# Patient Record
Sex: Male | Born: 1950 | Race: White | Hispanic: No | Marital: Single | Smoking: Former smoker
Health system: Southern US, Community
[De-identification: ages and names within clinical notes are randomized; demographics above are authoritative.]

## PROBLEM LIST (undated history)

## (undated) DIAGNOSIS — I10 Essential (primary) hypertension: Secondary | ICD-10-CM

## (undated) DIAGNOSIS — S065XAA Traumatic subdural hemorrhage with loss of consciousness status unknown, initial encounter: Secondary | ICD-10-CM

## (undated) DIAGNOSIS — S065X9A Traumatic subdural hemorrhage with loss of consciousness of unspecified duration, initial encounter: Secondary | ICD-10-CM

## (undated) DIAGNOSIS — K560 Paralytic ileus: Secondary | ICD-10-CM

## (undated) DIAGNOSIS — J690 Pneumonitis due to inhalation of food and vomit: Secondary | ICD-10-CM

## (undated) DIAGNOSIS — Z8673 Personal history of transient ischemic attack (TIA), and cerebral infarction without residual deficits: Secondary | ICD-10-CM

## (undated) DIAGNOSIS — E785 Hyperlipidemia, unspecified: Secondary | ICD-10-CM

## (undated) DIAGNOSIS — D649 Anemia, unspecified: Secondary | ICD-10-CM

## (undated) DIAGNOSIS — K219 Gastro-esophageal reflux disease without esophagitis: Secondary | ICD-10-CM

## (undated) DIAGNOSIS — F1910 Other psychoactive substance abuse, uncomplicated: Secondary | ICD-10-CM

## (undated) DIAGNOSIS — N10 Acute tubulo-interstitial nephritis: Secondary | ICD-10-CM

## (undated) HISTORY — DX: Other psychoactive substance abuse, uncomplicated: F19.10

## (undated) HISTORY — PX: OTHER SURGICAL HISTORY: SHX169

## (undated) HISTORY — DX: Paralytic ileus: K56.0

## (undated) HISTORY — DX: Traumatic subdural hemorrhage with loss of consciousness status unknown, initial encounter: S06.5XAA

## (undated) HISTORY — DX: Acute pyelonephritis: N10

## (undated) HISTORY — DX: Gastro-esophageal reflux disease without esophagitis: K21.9

## (undated) HISTORY — DX: Traumatic subdural hemorrhage with loss of consciousness of unspecified duration, initial encounter: S06.5X9A

## (undated) HISTORY — PX: APPENDECTOMY: SHX54

## (undated) HISTORY — DX: Essential (primary) hypertension: I10

## (undated) HISTORY — DX: Hyperlipidemia, unspecified: E78.5

## (undated) HISTORY — DX: Pneumonitis due to inhalation of food and vomit: J69.0

## (undated) HISTORY — DX: Personal history of transient ischemic attack (TIA), and cerebral infarction without residual deficits: Z86.73

## (undated) HISTORY — DX: Anemia, unspecified: D64.9

---

## 2012-12-03 DIAGNOSIS — J690 Pneumonitis due to inhalation of food and vomit: Secondary | ICD-10-CM

## 2012-12-03 DIAGNOSIS — Z8673 Personal history of transient ischemic attack (TIA), and cerebral infarction without residual deficits: Secondary | ICD-10-CM

## 2012-12-03 HISTORY — DX: Pneumonitis due to inhalation of food and vomit: J69.0

## 2012-12-03 HISTORY — DX: Personal history of transient ischemic attack (TIA), and cerebral infarction without residual deficits: Z86.73

## 2012-12-09 DIAGNOSIS — K560 Paralytic ileus: Secondary | ICD-10-CM

## 2012-12-09 HISTORY — DX: Paralytic ileus: K56.0

## 2012-12-20 ENCOUNTER — Non-Acute Institutional Stay (SKILLED_NURSING_FACILITY): Payer: PRIVATE HEALTH INSURANCE | Admitting: Internal Medicine

## 2012-12-20 DIAGNOSIS — I69959 Hemiplegia and hemiparesis following unspecified cerebrovascular disease affecting unspecified side: Secondary | ICD-10-CM

## 2012-12-20 DIAGNOSIS — S065X9A Traumatic subdural hemorrhage with loss of consciousness of unspecified duration, initial encounter: Secondary | ICD-10-CM

## 2012-12-20 DIAGNOSIS — N10 Acute tubulo-interstitial nephritis: Secondary | ICD-10-CM

## 2012-12-20 DIAGNOSIS — E860 Dehydration: Secondary | ICD-10-CM

## 2012-12-20 DIAGNOSIS — S065XAA Traumatic subdural hemorrhage with loss of consciousness status unknown, initial encounter: Secondary | ICD-10-CM

## 2012-12-20 DIAGNOSIS — I6200 Nontraumatic subdural hemorrhage, unspecified: Secondary | ICD-10-CM

## 2012-12-20 NOTE — Progress Notes (Signed)
Patient ID: Douglas Vargas, male   DOB: 08-22-1950, 62 y.o.   MRN: [id number]  Facility; [facility] Chief complaint; admission to SNF post admit to [facility] health care from October 1 to October 13  History; this is a 62 year old man who presented to [facility] after a fall and altered mental status with urinary incontinence. He had been out of his medications for a week [Plavix and aspirin for TIAs]. CT scan revealed a right subdural hematoma and a right middle cerebral artery ischemic [place]p with right to left midline shift the. The patient was intubated for airway protection and transferred to [facility]. He was noted to have continuous dysarthria and left sided weakness. Neurosurgery did not recommend surgery for his right subdural hematoma. He was kept on Keppra for seizure prophylaxis. Aspirin and Plavix were held due to subdural hematoma. There is some suggestion of an intracranial bleed as well. The patient received hypertonic saline and mannitol for hyponatremia with parenchymal edema and midline shift. He was said to have a waxing and waning mental status. He did not have alcohol withdrawal. He did have visual hallucinations. He was given thiamine.   On October 7 the patient was right upper quadrant ultrasound revealed distended gallbladder with stones and sludge.noted to have abdominal distention with tenderness. A KUB revealed ileo-as with the transverse colon at 8 mm.  A colonoscopy was done which was negative for. He also had acute kidney injury with a baseline creatinine of 0.6 rising to a high of 2 this resolved with IV fluid. His hydrochlorothiazide and lisinopril were discontinued. He was also felt to have a left lower lobe aspiration/chemical pneumonitis. He was given a course of clindamycin.  MRI of the brain on October 2 showed multiple areas of acute infarction in the right hemispheres and subdural hematomas.  Past medical  history; #1 TIAs #2 hypertension #3 tobacco abuse #4 alcohol abuse  Social; patient states he was living with a roommate and all [place]. He was a smoker and a daily alcohol intake beyond this I have no information.     Medication; Norvasc 10 mg a day; atorvastatin 80 daily, vitamin D 3 2003 tablets every day, clindamycin 300 every 6 stopped on October 14, vitamin B12 500 mcg 2 tablets daily, Colace 100 twice a day, but Toprolol 25 twice a day, multivitamin 1 tablet daily, omeprazole 20 mg daily, thiamine 100 daily,  Family history; patient states his father had a stroke  Review of systems    Respiratory; no cough no sputum Cardiac no chest pain GI no odontophagia no nausea no vomiting GU no cleared dysuria or hematuria Neurologic he does not complain of headache diplopia or dysphagia   physical examination O2 sat 92% on room air respirations 18 and unlabored pulse 92 HEENT very dry mucous membranes Lymph nonpalpable no cervical clavicular or axillary areas Respiratory; decreased air entry bilaterally but no crackles or wheezes there is no digital clubbing Cardiac heart sounds are normal there is no murmurs and no carotid bruits Abdomen his liver edge is palpable at the right costal margin there is no spleen there is no stigmata. No masses are noted. His abdomen does not appear to be distended. Bowel sounds are paws GU some suprapubic and right costal vertebral angle tenderness is noted Neurologic;  there is a right temporal visual field defect. Otherwise cranial nerves seem intact. He is beginning to have some mild recovery in all muscle groups in the left  arm and the left leg at 2-3/5. Reflexes are brisk on the left left Babinski is upgoing. Currently he is able to answer all my questions he does not appear to be delirious Extremities; mild venous stasis noted peripheral pulses are palpable.  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 The patient has a left-sided weakness is noted however his right-sided visual field defect is on the right. History of return of his strength on the left side gives her reason for guarded optimism #2 history of TIAs and now a major stroke. He is off all antiplatelet drugs because of the subdural hematoma. He is supposed to followup with his neurosurgeon who is Dr. Clelia Schaumann and I will be important to address this issue. #3 adynamic ileus this appears to have resolved the. #4 appears to be somewhat volume contracted his lab work will be checked we'll need to push oral fluids. #5 suprapubic and right CVA tenderness raises the possibility of a urinary tract infection A period of the right CVA and tenderness was so impressive that I am going to put him on empiric antibiotics will await for a urine culture #6 gastroesophageal reflux disease #7 hypertension he was on an ACE inhibitor prior to this stopped it due to acute kidney injury is now on metoprolol and amlodipine per #8 on atorvastatin I think this was since this stroke I don't believe he was on this long-term he is not a diabetic.  Several concerning issues with her with regards to this man. Poor oral intake is the paramount concern. I am going to treat him in paraplegia for a right pyelonephritis.

## 2012-12-22 ENCOUNTER — Non-Acute Institutional Stay (SKILLED_NURSING_FACILITY): Payer: PRIVATE HEALTH INSURANCE | Admitting: Family

## 2012-12-22 ENCOUNTER — Encounter: Payer: Self-pay | Admitting: Family

## 2012-12-22 DIAGNOSIS — R110 Nausea: Secondary | ICD-10-CM

## 2012-12-22 DIAGNOSIS — K219 Gastro-esophageal reflux disease without esophagitis: Secondary | ICD-10-CM

## 2012-12-22 DIAGNOSIS — I10 Essential (primary) hypertension: Secondary | ICD-10-CM

## 2012-12-22 NOTE — Progress Notes (Signed)
Patient ID: Douglas Vargas, male   DOB: 08-16-1950, 62 y.o.   MRN: [id number] Date:12/22/12  Facility: [facility]  Code Status:  Full  Chief Complaint  Patient presents with  . Acute Visit    Nausea    HPI: Pt presents with c/o of acute onset of nausea.  Pt denies vomiting, diarrhea, fever, chills, SOB, CP, or abdominal pain.  Pt describes nausea as constant, and endorses that nausea is sometime alleviated without intervention. Pt denies treatment of current bout of nausea. Pt reports regular bowel movement and regular appetite.Pt and health care team denies further concerns/issues at present.       Allergies  Allergen Reactions  . Sulfa Antibiotics      Medication List       This list is accurate as of: 12/22/12  3:36 PM.  Always use your most recent med list.               amLODipine 10 MG tablet  Commonly known as:  NORVASC  Take 10 mg by mouth daily.     atorvastatin 80 MG tablet  Commonly known as:  LIPITOR  Take 80 mg by mouth daily.     cyanocobalamin 500 MCG tablet  Take 500 mcg by mouth daily.     docusate sodium 100 MG capsule  Commonly known as:  COLACE  Take 100 mg by mouth 2 (two) times daily.     metoprolol tartrate 25 MG tablet  Commonly known as:  LOPRESSOR  Take 25 mg by mouth 2 (two) times daily.     multivitamin with minerals Tabs tablet  Take 1 tablet by mouth daily.     omeprazole 20 MG capsule  Commonly known as:  PRILOSEC  Take 20 mg by mouth daily.     thiamine 100 MG tablet  Take 100 mg by mouth daily.         DATA REVIEWED   Laboratory Studies: 12/15/12 WBC 11, Creatinine 0.6, K in mid 3s s/p multiple repletion (per discharge summary), CEA 2.3     Past Medical History  Diagnosis Date  . Hypertension   . Hyperlipidemia   . Anemia   . GERD (gastroesophageal reflux disease)        Review of Systems  Constitutional: Negative.   HENT: Negative.   Eyes: Negative.   Respiratory: Negative.   Cardiovascular:  Negative.   Gastrointestinal: Positive for nausea.  Genitourinary: Positive for urgency.  Musculoskeletal: Positive for falls.  Skin: Negative.   Neurological: Positive for focal weakness.  Endo/Heme/Allergies: Negative.   Psychiatric/Behavioral: Negative.      Physical Exam Filed Vitals:   12/22/12 1527  BP: 116/78  Pulse: 94  Temp: 96.9 F (36.1 C)  Resp: 20   There is no height or weight on file to calculate BMI. Physical Exam  Constitutional:  Pt is supine position in facility's bed with safety precautions in place. Pt wearing hospital gown.  HENT:  Mouth/Throat: Abnormal dentition.  Neck: No thyromegaly present.  Cardiovascular: Normal rate, regular rhythm and normal heart sounds.   Pulmonary/Chest: Effort normal and breath sounds normal.  Neurological: He is alert. GCS eye subscore is 4. GCS verbal subscore is 4. GCS motor subscore is 6.  Oriented to self/L sided hemiparesis  Skin: Skin is warm and dry.  Discoloration of BLE  Psychiatric: He has a normal mood and affect. His speech is normal. Thought content is paranoid.      ASSESSMENT/PLAN Hypertension-Will continue with current treatment plan;  pt is presently stable GERD-Will continue to monitor and evaluate treatment plan; pt endorses alleviation of nausea Nausea- Will continue to monitor for GI upset; Pt endorses nausea is mitigated without intervention at present   Follow up:prn

## 2013-01-01 ENCOUNTER — Non-Acute Institutional Stay (SKILLED_NURSING_FACILITY): Payer: PRIVATE HEALTH INSURANCE | Admitting: Internal Medicine

## 2013-01-01 DIAGNOSIS — D473 Essential (hemorrhagic) thrombocythemia: Secondary | ICD-10-CM | POA: Insufficient documentation

## 2013-01-01 DIAGNOSIS — D638 Anemia in other chronic diseases classified elsewhere: Secondary | ICD-10-CM

## 2013-01-01 NOTE — Progress Notes (Signed)
PROGRESS NOTE  DATE: 01/01/2013  FACILITY:  [facility]  LEVEL OF CARE: SNF (31)  Acute Visit  CHIEF COMPLAINT:  Manage anemia of chronic disease  HISTORY OF PRESENT ILLNESS: I was requested by the staff to assess the patient regarding above problem(s):  ANEMIA: The anemia has been stable. The patient denies fatigue, melena or hematochezia. Currently not on iron. On 12-22-12 hemoglobin 11, MCV 87.  PAST MEDICAL HISTORY : Reviewed.  No changes.  CURRENT MEDICATIONS: Reviewed per [facility]  PHYSICAL EXAMINATION  GENERAL: no acute distress, normal body habitus RESPIRATORY: breathing is even & unlabored, BS CTAB CARDIAC: RRR, no murmur,no extra heart sounds, no edema  LABS/RADIOLOGY: See history of present illness  12-22-12 platelets 450  ASSESSMENT/PLAN:  Anemia of chronic disease-Will monitor. Thrombocytosis-likely acute phase reactant will monitor.  CPT CODE: 84132

## 2013-03-15 ENCOUNTER — Encounter: Payer: Self-pay | Admitting: *Deleted

## 2013-03-18 ENCOUNTER — Encounter: Payer: Self-pay | Admitting: Family

## 2013-03-18 ENCOUNTER — Non-Acute Institutional Stay (SKILLED_NURSING_FACILITY): Payer: PRIVATE HEALTH INSURANCE | Admitting: Family

## 2013-03-18 DIAGNOSIS — M129 Arthropathy, unspecified: Secondary | ICD-10-CM

## 2013-03-18 DIAGNOSIS — M1990 Unspecified osteoarthritis, unspecified site: Secondary | ICD-10-CM

## 2013-03-18 DIAGNOSIS — I10 Essential (primary) hypertension: Secondary | ICD-10-CM

## 2013-03-18 DIAGNOSIS — K5900 Constipation, unspecified: Secondary | ICD-10-CM

## 2013-03-18 DIAGNOSIS — D638 Anemia in other chronic diseases classified elsewhere: Secondary | ICD-10-CM

## 2013-03-18 NOTE — Progress Notes (Signed)
Patient ID: SETH HIGGINBOTHAM, male   DOB: 1950/04/24, 63 y.o.   MRN: [id number]  Date: 03/18/13 Facility: [facility]  Code Status:  Full  Chief Complaint  Patient presents with  . Medical Managment of Chronic Issues    Routine Visit    HPI: Pt is being followed for the medical management of chronic illnesses. Pt reports bout of constipation that is unrelieved by current regimen.  Pt reports gas and abdominal discomfort.  Pt further report L shoulder pain onset > one week. Pt reports stretching arm in therapy and then experiencing acute pain shortly after. Pt reports L shoulder pain limits ROM.  Pt reports desire to review medical history with NP to discuss care plan. No further issues/concerns expressed at present.      Allergies  Allergen Reactions  . Sulfa Antibiotics      Medication List       This list is accurate as of: 03/18/13  3:35 PM.  Always use your most recent med list.               amLODipine 10 MG tablet  Commonly known as:  NORVASC  Take 10 mg by mouth daily.     atorvastatin 80 MG tablet  Commonly known as:  LIPITOR  Take 80 mg by mouth daily.     cholecalciferol 1000 UNITS tablet  Commonly known as:  VITAMIN D  Take 1,000 Units by mouth daily. 4000 units daily     cyanocobalamin 500 MCG tablet  Take 500 mcg by mouth daily.     docusate sodium 100 MG capsule  Commonly known as:  COLACE  Take 100 mg by mouth 2 (two) times daily.     metoprolol tartrate 25 MG tablet  Commonly known as:  LOPRESSOR  Take 25 mg by mouth 2 (two) times daily.     multivitamin with minerals Tabs tablet  Take 1 tablet by mouth daily.     omeprazole 20 MG capsule  Commonly known as:  PRILOSEC  Take 20 mg by mouth daily.         DATA REVIEWED  Laboratory Studies: 02/20/13-WBC 6.5, Hemoglobin 10.4, Hematocrit 32.5, Platelets 239 12/22/12-BUN 20, Creatinine 0.73, Na 144, K 4.6, Cl 100, Ca 8.9 Past Medical History  Diagnosis Date  . Hypertension   .  Hyperlipidemia   . Anemia   . GERD (gastroesophageal reflux disease)   . Substance abuse     alcohol  . Hx-TIA (transient ischemic attack) 12/03/2012    Acute right MCA  . Pneumonitis, aspiration 12/03/2012  . Adynamic ileus 12/09/2012  . Subdural hematoma   . Acute pyelonephritis     Review of Systems  Constitutional: Negative.   HENT: Negative.   Eyes: Negative.   Respiratory: Negative.   Cardiovascular: Negative.   Gastrointestinal: Positive for abdominal pain and constipation.  Musculoskeletal: Positive for joint pain.  Skin: Negative.   Neurological:       L sided hemiplegia  Endo/Heme/Allergies: Negative.   Psychiatric/Behavioral: Negative.      Physical Exam Filed Vitals:   03/18/13 1521  BP: 124/76  Pulse: 68  Temp: 97.6 F (36.4 C)  Resp: 18   There is no height or weight on file to calculate BMI. Physical Exam  Constitutional: He is oriented to person, place, and time.  Cardiovascular: Normal rate and regular rhythm.   Pulmonary/Chest: Effort normal and breath sounds normal.  Abdominal: Soft. Bowel sounds are normal.  Musculoskeletal:  Left shoulder: He exhibits decreased range of motion, tenderness and pain.  Neurological: He is alert and oriented to person, place, and time.    ASSESSMENT/PLAN 1)Unspecified Constipation-Obtain KUB, Dulcolax suppository x 1, Senna tablet daily 2)Arthritis-Xray L shoulder 3)Obtain CBC, CMP, Vitamin D, Vitamin B12, Folate 4)HTN- will continue current medication treatment; pt is stable 5)Subdural Hematoma-pt was referred to Neuro  6)GERD-will continue current medication; pt stable  7)Anemia of Chronic Disease-will obtain labs to evaluate  Follow up:prn  Time spent 50 minutes

## 2013-03-20 ENCOUNTER — Encounter: Payer: Self-pay | Admitting: *Deleted

## 2013-07-13 ENCOUNTER — Non-Acute Institutional Stay (SKILLED_NURSING_FACILITY): Payer: Medicare Other | Admitting: Internal Medicine

## 2013-07-13 DIAGNOSIS — E7800 Pure hypercholesterolemia, unspecified: Secondary | ICD-10-CM | POA: Insufficient documentation

## 2013-07-13 DIAGNOSIS — K219 Gastro-esophageal reflux disease without esophagitis: Secondary | ICD-10-CM

## 2013-07-13 DIAGNOSIS — I10 Essential (primary) hypertension: Secondary | ICD-10-CM

## 2013-07-13 DIAGNOSIS — K5900 Constipation, unspecified: Secondary | ICD-10-CM | POA: Insufficient documentation

## 2013-07-13 NOTE — Progress Notes (Signed)
         PROGRESS NOTE  DATE: 07/13/2013  FACILITY: Nursing Home Location: [facility]  LEVEL OF CARE: SNF (31)  Routine Visit  CHIEF COMPLAINT:  Manage hypertension, GERD and hyperlipidemia  HISTORY OF PRESENT ILLNESS:  REASSESSMENT OF ONGOING PROBLEM(S):  HTN: Pt 's HTN remains stable.  Staff Deny CP, sob, DOE, pedal edema, headaches, dizziness or visual disturbances.  No complications from the medications currently being used.  Last BP : 110/70. Patient is a poor historian.  HYPERLIPIDEMIA: No complications from the medications presently being used. In 12/14 HDL 32 otherwise Last fasting lipid panel normal  GERD: pt's GERD is stable.  Staff Deny ongoing heartburn, abd. Pain, nausea or vomiting.  Currently on a PPI & tolerates it without any adverse reactions.  PAST MEDICAL HISTORY : Reviewed.  No changes/see problem list  CURRENT MEDICATIONS: Reviewed per MAR/see medication list  REVIEW OF SYSTEMS: Unobtainable due to patient being a poor historian  PHYSICAL EXAMINATION  VS:  See VS section  GENERAL: no acute distress, normal body habitus EYES: conjunctivae normal, sclerae normal, normal eye lids NECK: supple, trachea midline, no neck masses, no thyroid tenderness, no thyromegaly LYMPHATICS: no LAN in the neck, no supraclavicular LAN RESPIRATORY: breathing is even & unlabored, BS CTAB CARDIAC: RRR, no murmur,no extra heart sounds, no edema GI: abdomen soft, normal BS, no masses, no tenderness, no hepatomegaly, no splenomegaly PSYCHIATRIC: the patient is alert & disoriented, affect & behavior appropriate  LABS/RADIOLOGY:  1-15 hemoglobin 11.5, MCV 83 otherwise CBC normal, alkaline phosphatase 142 otherwise CMP normal, vitamin B12 level  1425, folate greater than 19.9, vitamin D level 79.2  ASSESSMENT/PLAN:  Hypertension-well-controlled Hyperlipidemia-well controlled GERD-continue PPI Constipation-well-controlled History of CVA-continue  supportive care Anemia of chronic disease-stable  CPT CODE: 90300  Merlene Laughter, MD [place] [phone]

## 2013-08-05 ENCOUNTER — Non-Acute Institutional Stay (SKILLED_NURSING_FACILITY): Payer: PRIVATE HEALTH INSURANCE | Admitting: Internal Medicine

## 2013-08-05 DIAGNOSIS — D638 Anemia in other chronic diseases classified elsewhere: Secondary | ICD-10-CM

## 2013-08-09 NOTE — Progress Notes (Signed)
Patient ID: CRIS TALAVERA, male   DOB: 1950/09/29, 63 y.o.   MRN: [id number]            PROGRESS NOTE  DATE: 08/05/2013       FACILITY:  [facility]  LEVEL OF CARE: SNF (31)  Acute Visit  CHIEF COMPLAINT:  Manage anemia.    HISTORY OF PRESENT ILLNESS: I was requested by the staff to assess the patient regarding above problem(s):  ANEMIA: The anemia has been stable. The patient denies fatigue, melena or hematochezia. No complications from the medications currently being used.    PAST MEDICAL HISTORY : Reviewed.  No changes/see problem list  CURRENT MEDICATIONS: Reviewed per MAR/see medication list  PHYSICAL EXAMINATION  VS:  T 99.5       P 70      RR 18      BP 122/72     POX 97%       WT (Lb) 151      GENERAL: no acute distress, normal body habitus RESPIRATORY: breathing is even & unlabored, BS CTAB CARDIAC: RRR, no murmur,no extra heart sounds, no edema  ASSESSMENT/PLAN:  Anemia.  Hemoglobin stable.    CPT CODE: 38182         Gayani Y Dasanayaka, [place] [phone]

## 2013-08-16 ENCOUNTER — Non-Acute Institutional Stay (SKILLED_NURSING_FACILITY): Payer: PRIVATE HEALTH INSURANCE | Admitting: Internal Medicine

## 2013-08-16 DIAGNOSIS — I6200 Nontraumatic subdural hemorrhage, unspecified: Secondary | ICD-10-CM

## 2013-08-16 DIAGNOSIS — S065X9A Traumatic subdural hemorrhage with loss of consciousness of unspecified duration, initial encounter: Secondary | ICD-10-CM

## 2013-08-16 DIAGNOSIS — S065XAA Traumatic subdural hemorrhage with loss of consciousness status unknown, initial encounter: Secondary | ICD-10-CM

## 2013-08-16 DIAGNOSIS — I10 Essential (primary) hypertension: Secondary | ICD-10-CM

## 2013-08-16 DIAGNOSIS — I69959 Hemiplegia and hemiparesis following unspecified cerebrovascular disease affecting unspecified side: Secondary | ICD-10-CM

## 2013-08-18 NOTE — Progress Notes (Addendum)
Patient ID: Douglas Vargas, male   DOB: [birth year], 63 y.o.   MRN: [id number]                  HISTORY & PHYSICAL  DATE:  08/16/2013    FACILITY: [facility]    LEVEL OF CARE:   SNF   CHIEF COMPLAINT:  Review of medical issues/admission to [facility].    HISTORY OF PRESENT ILLNESS:  This is a now 63 year-old man who came to [place] after a stay at [facility] from December 03, 2012 through December 15, 2012.  At that point, he had suffered a right subdural hematoma on top of a right middle cerebral artery ischemic stroke.  This was quite extensive.  There was right to left midline shift.  He required intubation.  Neurosurgery did not recommend surgery for the right-sided subdural hematoma.   He was placed on Keppra for seizure prophylaxis.  He was previously on aspirin and Plavix, which were held due to the subdural hematoma.  His last MRI of the brain on December 04, 2012, showed multiple areas of acute infarction in the right hemisphere and a subdural hematoma.    PAST MEDICAL HISTORY/PROBLEM LIST:  TIAs.    Hypertension.    Tobacco abuse.    Alcohol abuse.    CURRENT MEDICATIONS:  Medication list is reviewed.    Norvasc 10 q.d.    Prilosec 20 q.d.    Vitamin B1, 100 mg daily.    Vitamin D3, 4000 U by mouth daily.    Senokot 8.6 daily.    Lopressor 25 b.i.d.    Lipitor 80 mg daily.    It appears he is still on nectar-thick liquids.    SOCIAL HISTORY: HOUSING:  He has become, I believe, a chronic patient here.   CODE STATUS:  He does not have advanced directives on the chart.    REVIEW OF SYSTEMS:   CHEST/RESPIRATORY:  He is not complaining of cough or shortness of breath.   CARDIAC:   No chest pain.   GI:  He is complaining of constipation.    GU:  No dysuria.    PHYSICAL EXAMINATION:   VITAL SIGNS:   BLOOD PRESSURE:  Appears to be well controlled in the 126/80 range.   PULSE:  58.   RESPIRATIONS:  18.   O2 SATURATIONS:  95% on room air.     GENERAL APPEARANCE:  The patient has made a nice recovery from his right-sided strokes.   CHEST/RESPIRATORY:  Clear air entry bilaterally.   CARDIOVASCULAR:  CARDIAC:   Heart sounds are normal.  There are no murmurs.  No carotid bruits.   GASTROINTESTINAL:  ABDOMEN:   Slightly distended, but no tenderness.   LIVER/SPLEEN/KIDNEYS:  No liver, no spleen.   GENITOURINARY:  BLADDER:   Not enlarged.   NEUROLOGICAL:   His only residual at this point appears to be left-sided arm weakness and spasticity.    ASSESSMENT/PLAN:  Late-effect dominant hemisphere stroke, which was complicated by a subdural at the time.  He has made a nice recovery from this.  I put in my original note that we would need to be careful in following up with the thought of putting him back on antiplatelet drugs.  I do not see any consult notes since January 02, 2013, at which time a repeat CT scan of the head was suggested.  I am, again, really not sure that this was done. Restart ASA seems reasonable. I don't think  asa and plavix is currently warrented.   Hypertension.  This appears to be well controlled.  He appears to be on metoprolol and amlodipine.

## 2013-09-26 ENCOUNTER — Encounter: Payer: Self-pay | Admitting: Internal Medicine

## 2013-09-26 NOTE — Progress Notes (Signed)
Patient ID: Douglas Vargas, male   DOB: 03/29/1950, 63 y.o.   MRN: [id number]                  HISTORY & PHYSICAL  DATE:  08/16/2013    FACILITY[facility]    LEVEL OF CARE:   SNF   CHIEF COMPLAINT:  Review of medical issues/admission to [facility].    HISTORY OF PRESENT ILLNESS:  This is a now 63 year-old man who came to [place] after a stay at [facility] from December 03, 2012 through December 15, 2012.  At that point, he had suffered a right subdural hematoma on top of a right middle cerebral artery ischemic stroke.  This was quite extensive.  There was right to left midline shift.  He required intubation.  Neurosurgery did not recommend surgery for the right-sided subdural hematoma.   He was placed on Keppra for seizure prophylaxis.  He was previously on aspirin and Plavix, which were held due to the subdural hematoma.  His last MRI of the brain on December 04, 2012, showed multiple areas of acute infarction in the right hemisphere and a subdural hematoma.    PAST MEDICAL HISTORY/PROBLEM LIST:  TIAs.    Hypertension.    Tobacco abuse.    Alcohol abuse.    CURRENT MEDICATIONS:  Medication list is reviewed.    Norvasc 10 q.d.    Prilosec 20 q.d.    Vitamin B1, 100 mg daily.    Vitamin D3, 4000 U by mouth daily.    Senokot 8.6 daily.    Lopressor 25 b.i.d.    Lipitor 80 mg daily.    It appears he is still on nectar-thick liquids.    SOCIAL HISTORY: HOUSING:  He has become, I believe, a chronic patient here.   CODE STATUS:  He does not have advanced directives on the chart.    REVIEW OF SYSTEMS:   CHEST/RESPIRATORY:  He is not complaining of cough or shortness of breath.   CARDIAC:   No chest pain.   GI:  He is complaining of constipation.    GU:  No dysuria.    PHYSICAL EXAMINATION:   VITAL SIGNS:   BLOOD PRESSURE:  Appears to be well controlled in the 126/80 range.   PULSE:  58.   RESPIRATIONS:  18.   O2 SATURATIONS:  95% on room air.     GENERAL APPEARANCE:  The patient has made a nice recovery from his right-sided strokes.   CHEST/RESPIRATORY:  Clear air entry bilaterally.   CARDIOVASCULAR:  CARDIAC:   Heart sounds are normal.  There are no murmurs.  No carotid bruits.   GASTROINTESTINAL:  ABDOMEN:   Slightly distended, but no tenderness.   LIVER/SPLEEN/KIDNEYS:  No liver, no spleen.   GENITOURINARY:  BLADDER:   Not enlarged.   NEUROLOGICAL:   His only residual at this point appears to be left-sided arm weakness and spasticity.    ASSESSMENT/PLAN:  Late-effect dominant hemisphere stroke, which was complicated by a subdural at the time.  He has made a nice recovery from this.  I put in my original note that we would need to be careful in following up with the thought of putting him back on antiplatelet drugs.  I do not see any consult notes since January 02, 2013, at which time a repeat CT scan of the head was suggested.  I am, again, really not sure that this was done. Restart ASA seems reasonable. I don't think  asa and plavix is currently warrented.   Hypertension.  This appears to be well controlled.  He appears to be on metoprolol and amlodipine.          This encounter was created in error - please disregard.

## 2013-10-05 ENCOUNTER — Non-Acute Institutional Stay (SKILLED_NURSING_FACILITY): Payer: PRIVATE HEALTH INSURANCE | Admitting: Internal Medicine

## 2013-10-05 DIAGNOSIS — D509 Iron deficiency anemia, unspecified: Secondary | ICD-10-CM

## 2013-10-07 NOTE — Progress Notes (Signed)
Patient ID: Douglas Vargas, male   DOB: 12-22-1950, 63 y.o.   MRN: [id number]           PROGRESS NOTE  DATE: 10/05/2013         FACILITY:  [facility]  LEVEL OF CARE: SNF (31)  Acute Visit  CHIEF COMPLAINT:  Manage iron deficiency.    HISTORY OF PRESENT ILLNESS: I was requested by the staff to assess the patient regarding above problem(s):  On 10/01/2013:  Patient's TIBC was 228, serum iron level 27, percent saturation 12, ferritin level 15.  Patient overall is a poor historian.  In July:  Hemoglobin was 10.3, MCV 90.    PAST MEDICAL HISTORY : Reviewed.  No changes/see problem list  CURRENT MEDICATIONS: Reviewed per MAR/see medication list  REVIEW OF SYSTEMS:  Unobtainable.  Patient is a poor historian.      PHYSICAL EXAMINATION  VS: see VS section  GENERAL: no acute distress, normal body habitus NECK: supple, trachea midline, no neck masses, no thyroid tenderness, no thyromegaly RESPIRATORY: breathing is even & unlabored, BS CTAB CARDIAC: RRR, no murmur,no extra heart sounds, no edema GI: abdomen soft, normal BS, no masses, no tenderness, no hepatomegaly, no splenomegaly PSYCHIATRIC: the patient is alert & oriented to person, affect & behavior appropriate  ASSESSMENT/PLAN:  Iron deficiency.  New problem.  Start ferrous sulfate 325 mg b.i.d.  Stool guaiacs pending.    CPT CODE: 60109          Douglas Vargas Y Douglas Vargas, Douglas Vargas [phone]

## 2013-10-09 ENCOUNTER — Other Ambulatory Visit: Payer: Self-pay | Admitting: Internal Medicine

## 2013-10-09 ENCOUNTER — Ambulatory Visit (HOSPITAL_COMMUNITY)
Admission: RE | Admit: 2013-10-09 | Discharge: 2013-10-09 | Disposition: A | Discharge status: Home / Self Care | Payer: PRIVATE HEALTH INSURANCE | Source: Ambulatory Visit | Attending: Internal Medicine | Admitting: Internal Medicine

## 2013-10-09 ENCOUNTER — Ambulatory Visit (HOSPITAL_COMMUNITY)
Admission: RE | Admit: 2013-10-09 | Discharge: 2013-10-09 | Disposition: A | Discharge status: Home / Self Care | Payer: Medicaid Other | Source: Ambulatory Visit | Attending: Internal Medicine | Admitting: Internal Medicine

## 2013-10-09 ENCOUNTER — Ambulatory Visit (HOSPITAL_COMMUNITY): Payer: PRIVATE HEALTH INSURANCE

## 2013-10-09 DIAGNOSIS — Z9181 History of falling: Secondary | ICD-10-CM | POA: Insufficient documentation

## 2013-10-09 DIAGNOSIS — I69991 Dysphagia following unspecified cerebrovascular disease: Secondary | ICD-10-CM | POA: Diagnosis not present

## 2013-10-09 DIAGNOSIS — R1310 Dysphagia, unspecified: Secondary | ICD-10-CM

## 2013-10-09 DIAGNOSIS — R1313 Dysphagia, pharyngeal phase: Secondary | ICD-10-CM | POA: Diagnosis not present

## 2013-10-09 DIAGNOSIS — K219 Gastro-esophageal reflux disease without esophagitis: Secondary | ICD-10-CM | POA: Insufficient documentation

## 2013-10-09 DIAGNOSIS — E785 Hyperlipidemia, unspecified: Secondary | ICD-10-CM | POA: Diagnosis not present

## 2013-10-09 DIAGNOSIS — I10 Essential (primary) hypertension: Secondary | ICD-10-CM | POA: Insufficient documentation

## 2013-10-09 NOTE — Procedures (Signed)
Objective Swallowing Evaluation: Modified Barium Swallowing Study  Patient Details  Name: Douglas Vargas MRN: [id number] Date of Birth: March 24, 1950  Today's Date: 10/09/2013 Time: 1120-1140 SLP Time Calculation (min): 20 min  Past Medical History:  Past Medical History  Diagnosis Date  . Hypertension   . Hyperlipidemia   . Anemia   . GERD (gastroesophageal reflux disease)   . Hx-TIA (transient ischemic attack) 12/03/2012    Acute right MCA  . Pneumonitis, aspiration 12/03/2012  . Adynamic ileus 12/09/2012  . Subdural hematoma   . Acute pyelonephritis   . Substance abuse     alcohol   Past Surgical History: No past surgical history on file. HPI:  63 year-old male resident of [facility] SNF referred for [place].  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  Hx of silent aspiration November 2014 MBS.  Pt has been on a regular, ground diet with nectar-thick liquids subsequently.       Assessment / Plan / Recommendation Clinical Impression  Dysphagia Diagnosis: Mild pharyngeal phase dysphagia Clinical impression: Pt presents with a mild pharyngeal dysphagia marked by delayed hyolaryngeal mobility, leading to trace aspiration of thin liquids on one occasion.  Immediate, spontaneous cough response was elicited in reaction to aspiration.  Pt presented with intermittent, high penetration of thin liquids.  A chin tuck was effective in minimizing penetration and preventing aspiration on subsequent swallows.  However, pt consumed multiple boluses of thin liquid with head in neutral position without further aspiration.  Solids were masticated effectively and propelled through oropharynx without deficit.   Recommend advancing to a regular diet and thin liquids; if coughing  occurs during meal times, execution of chin tuck should assist with airway protection. Impulsivity may impact safety.    Video was reviewed and discussed with pt in real time; he agrees with recommendations.      Treatment Recommendation  Defer treatment plan to SLP at (Comment)    Diet Recommendation Regular;Thin liquid   Liquid Administration via: Cup Medication Administration: Whole meds with puree (or crushed - per pt's preferences) Supervision: Full supervision/cueing for compensatory strategies Compensations: Slow rate;Small sips/bites Postural Changes and/or Swallow Maneuvers: Chin tuck (if coughing is noted during consumption of thin liquids)    Other  Recommendations Oral Care Recommendations: Oral care BID   Follow Up Recommendations  Skilled Nursing facility    SLP Swallow Goals     General HPI: 63 year-old male resident of [facility] referred for [facility].  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  Hx of silent aspiration November 2014 MBS.  Pt has been on a regular, ground diet with nectar-thick liquids subsequently.   Type of Study: Modified Barium Swallowing Study Reason for Referral: Objectively evaluate swallowing function Previous Swallow Assessment: nov 2014 - silent aspiration thin liquids Diet Prior to this Study: Thin liquids (regular/ground) Temperature Spikes Noted: No Respiratory Status: Room air History of Recent Intubation: No Behavior/Cognition: Alert;Cooperative;Distractible Oral Cavity - Dentition: Adequate natural dentition Oral Motor / Sensory Function: Within functional limits Self-Feeding Abilities: Able to feed self Patient Positioning: Upright in chair Baseline Vocal Quality: Clear Volitional Cough: Strong Volitional Swallow: Able to elicit Anatomy: Within functional limits Pharyngeal Secretions: Not observed secondary MBS    Reason for Referral Objectively evaluate swallowing function  Oral Phase Oral Preparation/Oral Phase Oral Phase:  WFL   Pharyngeal Phase Pharyngeal Phase Pharyngeal Phase: Impaired Pharyngeal - Thin Pharyngeal - Thin Cup: Penetration/Aspiration during swallow;Compensatory strategies attempted (Comment);Trace aspiration;Reduced airway/laryngeal closure Penetration/Aspiration details (thin cup): Material enters airway, passes BELOW cords and not ejected out despite cough attempt by patient  Cervical Esophageal Phase    GO         Functional Assessment Tool Used: clinical judgement Functional Limitations: Swallowing Swallow Current Status (V8938): At least 1 percent but less than 20 percent impaired, limited or restricted Swallow Goal Status [phone]): At least 1 percent but less than 20 percent impaired, limited or restricted Swallow Discharge Status [phone]): At least 1 percent but less than 20 percent impaired, limited or restricted   Douglas Milan L. Douglas Vargas, [place] CCC/SLP Pager [phone]  Douglas Vargas 10/09/2013, 12:32 PM

## 2013-10-23 ENCOUNTER — Non-Acute Institutional Stay (SKILLED_NURSING_FACILITY): Payer: PRIVATE HEALTH INSURANCE | Admitting: Internal Medicine

## 2013-10-23 DIAGNOSIS — I10 Essential (primary) hypertension: Secondary | ICD-10-CM

## 2013-10-23 DIAGNOSIS — I69959 Hemiplegia and hemiparesis following unspecified cerebrovascular disease affecting unspecified side: Secondary | ICD-10-CM

## 2013-10-23 DIAGNOSIS — D509 Iron deficiency anemia, unspecified: Secondary | ICD-10-CM

## 2013-10-24 NOTE — Progress Notes (Signed)
Patient ID: Douglas Vargas, male   DOB: [birth year], 63 y.o.   MRN: [id number]                    DATE:  10/24/2012  FAC[facility]: [facility]    LEVEL OF CARE:   SNF   CHIEF COMPLAINT:  Review of medical issues/[employer] note for July    HISTORY OF PRESENT ILLNESS:  This is a now 63 year-old man who came to [place] after a stay at [facility] from December 03, 2012 through December 15, 2012.  At that point, he had suffered a right subdural hematoma on top of a right middle cerebral artery ischemic stroke.  This was quite extensive.  There was right to left midline shift.  He required intubation.  Neurosurgery did not recommend surgery for the right-sided subdural hematoma.   He was placed on Keppra for seizure prophylaxis.  He was previously on aspirin and Plavix, which were held due to the subdural hematoma.  His last MRI of the brain on December 04, 2012, showed multiple areas of acute infarction in the right hemisphere and a subdural hematoma.    Reason problem seems to be a progressive anemia. He is hemoglobin on admission to the facility was 11.0 in November 2014. In June 2015 it was 11.6 most recently at 10.3 his serum iron was 27 TIBC also slightly low at 228 iron saturation of 12 ferritin at 50 stool guaiacs have been ordered and are apparently negative. Vitamin B12 was 463 folate 18.5 both within the normal range. I had started him back on enteric-coated aspirin for his history of TIAs and stroke    PAST MEDICAL HISTORY/PROBLEM LIST:  TIAs.    Hypertension.    Tobacco abuse.    Alcohol abuse.    CURRENT MEDICATIONS:  Medication list is reviewed.        Enteric-coated ASA 81 daily       Norvasc 10 q.d.    Prilosec 20 q.d.    Vitamin B1, 100 mg daily.    Vitamin D3, 4000 U by mouth daily.    Senokot 8.6 daily.    Lopressor 25 b.i.d.    Lipitor 80 mg daily.    It appears he is still on nectar-thick liquids.  Recent swallowing study done  SOCIAL HISTORY: HOUSING:  He  has become, I believe, a chronic patient here.   CODE STATUS:  He does not have advanced directives on the chart.    REVIEW OF SYSTEMS:   CHEST/RESPIRATORY:  He is not complaining of cough or shortness of breath.   CARDIAC:   No chest pain.   GI:  He is complaining of constipation.    GU:  No dysuria.    PHYSICAL EXAMINATION:   VITAL SIGNS:   BLOOD PRESSURE:  Appears to be well controlled in the 122/68 range.   PULSE:  64.   RESPIRATIONS:  18.   O2 SATURATIONS:  95% on room air.   GENERAL APPEARANCE:  The patient has made a nice recovery from his right-sided strokes.   CHEST/RESPIRATORY:  Clear air entry bilaterally.   CARDIOVASCULAR:  CARDIAC:   Heart sounds are normal.  There are no murmurs.  No carotid bruits.   GASTROINTESTINAL:  ABDOMEN:   Slightly distended, but no tenderness.   LIVER/SPLEEN/KIDNEYS:  No liver, no spleen.   GENITOURINARY:  BLADDER:   Not enlarged.   NEUROLOGICAL:   His only residual at this point appears to be left-sided arm  weakness and spasticity.    ASSESSMENT/PLAN:  Late-effect dominant hemisphere stroke, which was complicated by a subdural at the time.  He has made a nice recovery from this.  I put him back on enteric-coated ASA 81 mg in June  Hypertension.  This appears to be well controlled.  He appears to be on metoprolol and amlodipine.    Anemia which is probably iron deficiency vs. chronic disease. He is on ferrous sulfate 325 twice a day. Followup hemoglobin with reticulocyte count would seem indicated.  Hyperlipidemia on a statin his LDL on 6/3 was 34. Hemoglobin A1c was 5

## 2013-12-01 ENCOUNTER — Non-Acute Institutional Stay (SKILLED_NURSING_FACILITY): Payer: PRIVATE HEALTH INSURANCE | Admitting: Internal Medicine

## 2013-12-01 DIAGNOSIS — I69959 Hemiplegia and hemiparesis following unspecified cerebrovascular disease affecting unspecified side: Secondary | ICD-10-CM

## 2013-12-01 DIAGNOSIS — D509 Iron deficiency anemia, unspecified: Secondary | ICD-10-CM

## 2013-12-01 NOTE — Progress Notes (Signed)
Patient ID: Douglas Vargas, male   DOB: 1950/10/09, 63 y.o.   MRN: [id number]                    DATE:  12/01/2013  [facility]AC[facility]: [facility]    LEVEL OF CARE:   SNF   CHIEF COMPLAINT:  Review of medical issues/[employer] note     HISTORY OF PRESENT ILLNESS:  This is a now 63 year-old man who came to [place] after a stay at [facility] from December 03, 2012 through December 15, 2012.  At that point, he had suffered a right subdural hematoma on top of a right middle cerebral artery ischemic stroke.  This was quite extensive.  There was right to left midline shift.  He required intubation.  Neurosurgery did not recommend surgery for the right-sided subdural hematoma.   He was placed on Keppra for seizure prophylaxis.  He was previously on aspirin and Plavix, which were held due to the subdural hematoma.  His last MRI of the brain on December 04, 2012, showed multiple areas of acute infarction in the right hemisphere and a subdural hematoma.    Reason problem seems to be a progressive anemia. He is hemoglobin on admission to the facility was 11.0 in November 2014. In June 2015 it was 11.6 most recently at 10.3 his serum iron was 27 TIBC also slightly low at 228 iron saturation of 12 ferritin at 15 stool guaiacs have been ordered and are apparently negative. Vitamin B12 was 463 folate 18.5 both within the normal range. I had started him back on enteric-coated aspirin for his history of TIAs and stroke    PAST MEDICAL HISTORY/PROBLEM LIST:  TIAs.    Hypertension.    Tobacco abuse.    Alcohol abuse.    CURRENT MEDICATIONS:  Medication list is reviewed.        Enteric-coated ASA 81 daily       Norvasc 10 q.d.    Prilosec 20 q.d.    Vitamin B1, 100 mg daily.    Vitamin D3, 4000 U by mouth daily.    Senokot 8.6 daily.    Lopressor 25 b.i.d.    Lipitor 80 mg daily.    It appears he is still on nectar-thick liquids.  Recent swallowing study done  SOCIAL HISTORY: HOUSING:  He has  become, I believe, a chronic patient here.   CODE STATUS:  He does not have advanced directives on the chart.    REVIEW OF SYSTEMS:   CHEST/RESPIRATORY:  He is not complaining of cough or shortness of breath.   CARDIAC:   No chest pain.   GI:  He is complaining of constipation.    GU:  No dysuria.    PHYSICAL EXAMINATION:   VITAL SIGNS:   BLOOD PRESSURE:  Appears to be well controlled in the 122/68 range.   PULSE:  64.   RESPIRATIONS:  18.   O2 SATURATIONS:  95% on room air.   GENERAL APPEARANCE:  The patient has made a nice recovery from his right-sided strokes.   CHEST/RESPIRATORY:  Clear air entry bilaterally.   CARDIOVASCULAR:  CARDIAC:   Heart sounds are normal.  There are no murmurs.  No carotid bruits.   GASTROINTESTINAL:  ABDOMEN:   Slightly distended, but no tenderness.   LIVER/SPLEEN/KIDNEYS:  No liver, no spleen.   GENITOURINARY:  BLADDER:   Not enlarged.   NEUROLOGICAL:   His only residual at this point appears to be left-sided arm weakness  and spasticity.    ASSESSMENT/PLAN:  Late-effect dominant hemisphere stroke, which was complicated by a subdural at the time.  He has made a nice recovery from this.  I put him back on enteric-coated ASA 81 mg in June  Hypertension.  This appears to be well controlled.  He appears to be on metoprolol and amlodipine.    Anemia which is probably iron deficiency vs. chronic disease. He is on ferrous sulfate 325 twice a day. Followup hemoglobin with reticulocyte count would seem indicated. I will write this order.  Hyperlipidemia on a statin his LDL on 6/3 was 34. Hemoglobin A1c was 5

## 2014-07-14 ENCOUNTER — Emergency Department (HOSPITAL_COMMUNITY)
Admission: EM | Admit: 2014-07-14 | Discharge: 2014-07-15 | Disposition: A | Discharge status: Home / Self Care | Payer: Medicaid Other | Attending: Emergency Medicine | Admitting: Emergency Medicine

## 2014-07-14 DIAGNOSIS — Z7982 Long term (current) use of aspirin: Secondary | ICD-10-CM | POA: Diagnosis not present

## 2014-07-14 DIAGNOSIS — Z87448 Personal history of other diseases of urinary system: Secondary | ICD-10-CM | POA: Insufficient documentation

## 2014-07-14 DIAGNOSIS — Z8673 Personal history of transient ischemic attack (TIA), and cerebral infarction without residual deficits: Secondary | ICD-10-CM | POA: Diagnosis not present

## 2014-07-14 DIAGNOSIS — I10 Essential (primary) hypertension: Secondary | ICD-10-CM | POA: Insufficient documentation

## 2014-07-14 DIAGNOSIS — S42292A Other displaced fracture of upper end of left humerus, initial encounter for closed fracture: Secondary | ICD-10-CM | POA: Insufficient documentation

## 2014-07-14 DIAGNOSIS — E785 Hyperlipidemia, unspecified: Secondary | ICD-10-CM | POA: Insufficient documentation

## 2014-07-14 DIAGNOSIS — Y92129 Unspecified place in nursing home as the place of occurrence of the external cause: Secondary | ICD-10-CM | POA: Diagnosis not present

## 2014-07-14 DIAGNOSIS — Z79899 Other long term (current) drug therapy: Secondary | ICD-10-CM | POA: Insufficient documentation

## 2014-07-14 DIAGNOSIS — Y999 Unspecified external cause status: Secondary | ICD-10-CM | POA: Diagnosis not present

## 2014-07-14 DIAGNOSIS — Z87891 Personal history of nicotine dependence: Secondary | ICD-10-CM | POA: Diagnosis not present

## 2014-07-14 DIAGNOSIS — K219 Gastro-esophageal reflux disease without esophagitis: Secondary | ICD-10-CM | POA: Insufficient documentation

## 2014-07-14 DIAGNOSIS — Z8701 Personal history of pneumonia (recurrent): Secondary | ICD-10-CM | POA: Insufficient documentation

## 2014-07-14 DIAGNOSIS — S42202A Unspecified fracture of upper end of left humerus, initial encounter for closed fracture: Secondary | ICD-10-CM

## 2014-07-14 DIAGNOSIS — S4992XA Unspecified injury of left shoulder and upper arm, initial encounter: Secondary | ICD-10-CM | POA: Diagnosis present

## 2014-07-14 DIAGNOSIS — Z862 Personal history of diseases of the blood and blood-forming organs and certain disorders involving the immune mechanism: Secondary | ICD-10-CM | POA: Diagnosis not present

## 2014-07-14 DIAGNOSIS — Y939 Activity, unspecified: Secondary | ICD-10-CM | POA: Diagnosis not present

## 2014-07-14 DIAGNOSIS — W010XXA Fall on same level from slipping, tripping and stumbling without subsequent striking against object, initial encounter: Secondary | ICD-10-CM | POA: Diagnosis not present

## 2014-07-14 NOTE — ED Provider Notes (Signed)
CSN: [id number]     Arrival date & time 07/14/14  2349 History  This chart was scribed for Douglas Schmidt, MD by Delphia Grates, ED Scribe. This patient was seen in room D36C/D36C and the patient's care was started at 12:39 AM.    Chief Complaint  Patient presents with  . Fall    The history is provided by the patient. No language interpreter was used.     HPI Comments: Douglas Vargas is a 64 y.o. male who presents to the Emergency Department after a fall that occurred 12 hours ago. Patient is from a nursing home. He states he was ambulating in his room and reports his foot slipped from his shoe which caused him to fall, landing on his left shoulder. There is associated sudden onset, constant, 5/10 LUE pain. Patient reports history of stroke and notes left sided weakness. He denies any other injuries or symptoms at this time. Denies head injury. No LOC. No neck pain or hip pain   Past Medical History  Diagnosis Date  . Hypertension   . Hyperlipidemia   . Anemia   . GERD (gastroesophageal reflux disease)   . Hx-TIA (transient ischemic attack) 12/03/2012    Acute right MCA  . Pneumonitis, aspiration 12/03/2012  . Adynamic ileus 12/09/2012  . Subdural hematoma   . Acute pyelonephritis   . Substance abuse     alcohol   History reviewed. No pertinent past surgical history. History reviewed. No pertinent family history. History  Substance Use Topics  . Smoking status: Former Research scientist (life sciences)  . Smokeless tobacco: Not on file  . Alcohol Use: No    Review of Systems  A complete 10 system review of systems was obtained and all systems are negative except as noted in the HPI and PMH.    Allergies  Sulfa antibiotics  Home Medications   Prior to Admission medications   Medication Sig Start Date End Date Taking? Authorizing Provider  acetaminophen (TYLENOL) 325 MG tablet Take 650 mg by mouth every 6 (six) hours as needed (DO NOT EXCEED 4 GM OF TYLENOL IN 24 HOURS).    Historical Provider,  MD  amLODipine (NORVASC) 10 MG tablet Take 10 mg by mouth daily.    Historical Provider, MD  aspirin EC 81 MG tablet Take 81 mg by mouth daily.    Historical Provider, MD  atorvastatin (LIPITOR) 80 MG tablet Take 80 mg by mouth daily.    Historical Provider, MD  Cholecalciferol 2000 UNITS TABS Take 4,000 Units by mouth daily. Take 2 tablets=4000 units by mouth daily.    Historical Provider, MD  metoprolol tartrate (LOPRESSOR) 25 MG tablet Take 25 mg by mouth 2 (two) times daily.    Historical Provider, MD  Multiple Vitamins-Minerals (CERTAGEN PO) Take by mouth daily.    Historical Provider, MD  omeprazole (PRILOSEC) 20 MG capsule Take 20 mg by mouth daily. Do not crush, take on an empty stomach    Historical Provider, MD  senna (SENOKOT) 8.6 MG TABS tablet Take 1 tablet by mouth daily.    Historical Provider, MD  thiamine (VITAMIN B-1) 100 MG tablet Take 100 mg by mouth daily.    Historical Provider, MD   Triage Vitals: BP 165/112 mmHg  Pulse 110  Temp(Src) 98.5 F (36.9 C) (Oral)  Resp 20  SpO2 100%  Physical Exam  Constitutional: He is oriented to person, place, and time. He appears well-developed and well-nourished.  HENT:  Head: Normocephalic and atraumatic.  Eyes: EOM are normal.  Neck: Normal range of motion.  No C-spine tenderness.  Cardiovascular: Normal rate, regular rhythm, normal heart sounds and intact distal pulses.   Normal left radial pulse.  Pulmonary/Chest: Effort normal and breath sounds normal. No respiratory distress.  Abdominal: Soft. He exhibits no distension. There is no tenderness.  Musculoskeletal: Normal range of motion. He exhibits edema and tenderness.  Tenderness and swelling of left proximal humerus.  Full ROM of bilateral hips  Neurological: He is alert and oriented to person, place, and time.  Strong grip on left hand.   Skin: Skin is warm and dry.  Psychiatric: He has a normal mood and affect. Judgment normal.  Nursing note and vitals  reviewed.   ED Course  Procedures (including critical care time)  DIAGNOSTIC STUDIES: Oxygen Saturation is 100% on room air, normal by my interpretation.    COORDINATION OF CARE: At 49 Discussed treatment plan with patient which includes imaging. Patient agrees.   Labs Review Labs Reviewed - No data to display  Imaging Review Dg Shoulder Left  07/15/2014   CLINICAL DATA:  Left humerus pain after fall today.  EXAM: LEFT SHOULDER - 2+ VIEW  COMPARISON:  None.  FINDINGS: Transverse fracture of the left humeral neck with probable extension to the greater tuberosity. Lateral angulation of the distal fracture fragment. No obvious glenohumeral dislocation. Acromioclavicular and coracoclavicular spaces are maintained.  IMPRESSION: Comminuted fracture of the proximal left humerus involving the humeral neck with extension to the greater tuberosity. Lateral angulation of the distal fracture fragment.   Electronically Signed   By: Lucienne Capers M.D.   On: 07/15/2014 01:26   Dg Humerus Left  07/15/2014   CLINICAL DATA:  [place] today.  EXAM: LEFT HUMERUS - 2+ VIEW  COMPARISON:  None  FINDINGS: There is a proximal left humeral fracture this is mildly displaced. Remainder of the humerus is intact. No bone lesion or bony destruction is evident.  IMPRESSION: Proximal left humeral fracture.   Electronically Signed   By: Andreas Newport M.D.   On: 07/15/2014 01:26     EKG Interpretation None      MDM   Final diagnoses:  Fracture, humerus, proximal, left, closed, initial encounter    Outpatient ortho follow up. Home in sling. C spine cleared by nexus criteria.  I personally performed the services described in this documentation, which was scribed in my presence. The recorded information has been reviewed and is accurate.      Douglas Schmidt, MD 07/15/14 [phone]

## 2014-07-15 ENCOUNTER — Encounter (HOSPITAL_COMMUNITY): Payer: Self-pay | Admitting: *Deleted

## 2014-07-15 ENCOUNTER — Emergency Department (HOSPITAL_COMMUNITY): Payer: Medicaid Other

## 2014-07-15 MED ORDER — HYDROCODONE-ACETAMINOPHEN 5-325 MG PO TABS: 1.0000 | ORAL_TABLET | ORAL | Status: DC | PRN

## 2014-07-15 NOTE — ED Notes (Signed)
Report called to [facility], [place]

## 2014-07-15 NOTE — ED Notes (Signed)
Pt in radiology 

## 2014-07-15 NOTE — ED Notes (Signed)
[facility] called for transport.  

## 2014-07-15 NOTE — ED Notes (Signed)
Pt in after a fall at his nursing home earlier today, xray there indicated a left humerus fx, pt sent here for further evaluation

## 2014-07-30 ENCOUNTER — Other Ambulatory Visit: Payer: Self-pay | Admitting: Orthopaedic Surgery

## 2014-07-30 DIAGNOSIS — M25512 Pain in left shoulder: Secondary | ICD-10-CM

## 2014-08-06 ENCOUNTER — Ambulatory Visit
Admission: RE | Admit: 2014-08-06 | Discharge: 2014-08-06 | Disposition: A | Discharge status: Home / Self Care | Payer: Medicaid Other | Source: Ambulatory Visit | Attending: Orthopaedic Surgery | Admitting: Orthopaedic Surgery

## 2014-08-06 DIAGNOSIS — M25512 Pain in left shoulder: Secondary | ICD-10-CM

## 2015-11-23 ENCOUNTER — Emergency Department (HOSPITAL_COMMUNITY): Payer: Medicare Other

## 2015-11-23 ENCOUNTER — Encounter (HOSPITAL_COMMUNITY): Payer: Self-pay | Admitting: *Deleted

## 2015-11-23 ENCOUNTER — Inpatient Hospital Stay (HOSPITAL_COMMUNITY)
Admission: EM | Admit: 2015-11-23 | Discharge: 2015-11-27 | DRG: 470 | Disposition: A | Discharge status: Skilled Nursing Facility | Payer: Medicare Other | Attending: Family Medicine | Admitting: Family Medicine

## 2015-11-23 DIAGNOSIS — S72012A Unspecified intracapsular fracture of left femur, initial encounter for closed fracture: Secondary | ICD-10-CM | POA: Diagnosis not present

## 2015-11-23 DIAGNOSIS — I10 Essential (primary) hypertension: Secondary | ICD-10-CM | POA: Diagnosis present

## 2015-11-23 DIAGNOSIS — D72829 Elevated white blood cell count, unspecified: Secondary | ICD-10-CM | POA: Diagnosis present

## 2015-11-23 DIAGNOSIS — Z419 Encounter for procedure for purposes other than remedying health state, unspecified: Secondary | ICD-10-CM

## 2015-11-23 DIAGNOSIS — Z87891 Personal history of nicotine dependence: Secondary | ICD-10-CM

## 2015-11-23 DIAGNOSIS — K219 Gastro-esophageal reflux disease without esophagitis: Secondary | ICD-10-CM | POA: Diagnosis present

## 2015-11-23 DIAGNOSIS — M25552 Pain in left hip: Secondary | ICD-10-CM

## 2015-11-23 DIAGNOSIS — W1830XA Fall on same level, unspecified, initial encounter: Secondary | ICD-10-CM | POA: Diagnosis present

## 2015-11-23 DIAGNOSIS — W19XXXA Unspecified fall, initial encounter: Secondary | ICD-10-CM

## 2015-11-23 DIAGNOSIS — E7800 Pure hypercholesterolemia, unspecified: Secondary | ICD-10-CM | POA: Diagnosis present

## 2015-11-23 DIAGNOSIS — Z7982 Long term (current) use of aspirin: Secondary | ICD-10-CM

## 2015-11-23 DIAGNOSIS — G459 Transient cerebral ischemic attack, unspecified: Secondary | ICD-10-CM | POA: Diagnosis present

## 2015-11-23 DIAGNOSIS — S72002A Fracture of unspecified part of neck of left femur, initial encounter for closed fracture: Secondary | ICD-10-CM | POA: Diagnosis present

## 2015-11-23 DIAGNOSIS — D62 Acute posthemorrhagic anemia: Secondary | ICD-10-CM | POA: Diagnosis not present

## 2015-11-23 DIAGNOSIS — Z8673 Personal history of transient ischemic attack (TIA), and cerebral infarction without residual deficits: Secondary | ICD-10-CM

## 2015-11-23 NOTE — ED Notes (Signed)
Bed: WA20 Expected date: 11/23/15 Expected time:  Means of arrival: Ambulance Comments: EMS fall from SNF-hip deformity

## 2015-11-23 NOTE — ED Notes (Signed)
Patient transported to X-ray 

## 2015-11-23 NOTE — ED Provider Notes (Addendum)
[facility] DEPT Provider Note   CSN: [id number] Arrival date & time: 11/23/15  2306  By signing my name below, I, Maud Deed. Royston Sinner, attest that this documentation has been prepared under the direction and in the presence of Layla Gramm, MD.  Electronically Signed: Maud Deed. Royston Sinner, ED Scribe. 11/23/15. 12:04 AM.    History   Chief Complaint No chief complaint on file.  The history is provided by the EMS personnel and the patient.  Fall  This is a new problem. The current episode started 1 to 2 hours ago. The problem occurs constantly. The problem has not changed since onset.Pertinent negatives include no abdominal pain. Nothing aggravates the symptoms. Nothing relieves the symptoms. He has tried nothing for the symptoms. The treatment provided no relief.    HPI Comments: Douglas Vargas, brought in by EMS from [facility] is a 65 y.o. male with a PMHx of HLD and HTN who presents to the Emergency Department here for a fall sustained just prior to arrival. Pt states he was looking for his money when he fell landing on his L hip. Discomfort to L hip and extremity is made worse with movement. No alleviating factors at this time. No head trauma or LOC. No interventions given en route to department. No recent fever, chills, nausea, or vomiting.   PCP: No primary care provider on file.    Past Medical History:  Diagnosis Date  . Acute pyelonephritis   . Adynamic ileus 12/09/2012  . Anemia   . GERD (gastroesophageal reflux disease)   . Hx-TIA (transient ischemic attack) 12/03/2012   Acute right MCA  . Hyperlipidemia   . Hypertension   . Pneumonitis, aspiration 12/03/2012  . Subdural hematoma   . Substance abuse    alcohol    Patient Active Problem List   Diagnosis Date Noted  . Pure hypercholesterolemia 07/13/2013  . Unspecified constipation 07/13/2013  . Anemia of other chronic disease 01/01/2013  . Essential thrombocythemia ([place]) 01/01/2013  . Nausea alone 12/22/2012    . HTN (hypertension) 12/22/2012  . GERD (gastroesophageal reflux disease) 12/22/2012    No past surgical history on file.     Home Medications    Prior to Admission medications   Medication Sig Start Date End Date Taking? Authorizing Provider  acetaminophen (TYLENOL) 325 MG tablet Take 650 mg by mouth every 6 (six) hours as needed (DO NOT EXCEED 4 GM OF TYLENOL IN 24 HOURS).    Historical Provider, MD  amLODipine (NORVASC) 10 MG tablet Take 10 mg by mouth daily.    Historical Provider, MD  aspirin EC 81 MG tablet Take 81 mg by mouth daily.    Historical Provider, MD  atorvastatin (LIPITOR) 80 MG tablet Take 80 mg by mouth daily.    Historical Provider, MD  Cholecalciferol 2000 UNITS TABS Take 4,000 Units by mouth daily. Take 2 tablets=4000 units by mouth daily.    Historical Provider, MD  HYDROcodone-acetaminophen (NORCO/VICODIN) 5-325 MG per tablet Take 1 tablet by mouth every 4 (four) hours as needed for moderate pain. 07/15/14   Jola Schmidt, MD  metoprolol tartrate (LOPRESSOR) 25 MG tablet Take 25 mg by mouth 2 (two) times daily.    Historical Provider, MD  Multiple Vitamins-Minerals (CERTAGEN PO) Take by mouth daily.    Historical Provider, MD  omeprazole (PRILOSEC) 20 MG capsule Take 20 mg by mouth daily. Do not crush, take on an empty stomach    Historical Provider, MD  senna (SENOKOT) 8.6 MG TABS tablet  Take 1 tablet by mouth daily.    Historical Provider, MD  thiamine (VITAMIN B-1) 100 MG tablet Take 100 mg by mouth daily.    Historical Provider, MD    Family History No family history on file.  Social History Social History  Substance Use Topics  . Smoking status: Former Research scientist (life sciences)  . Smokeless tobacco: Not on file  . Alcohol use No     Allergies   Sulfa antibiotics   Review of Systems Review of Systems  Constitutional: Negative for chills and fever.  Respiratory: Negative for cough.   Gastrointestinal: Negative for abdominal pain.  Musculoskeletal: Positive for  arthralgias.  All other systems reviewed and are negative.    Physical Exam Updated Vital Signs BP (!) 174/101 (BP Location: Left Arm)   Pulse 73   Temp 97.7 F (36.5 C) (Oral)   Resp 20   SpO2 96%   Physical Exam  Constitutional: He is oriented to person, place, and time. He appears well-developed and well-nourished.  HENT:  Head: Normocephalic and atraumatic. Head is without raccoon's eyes and without Battle's sign.  Right Ear: No hemotympanum.  Left Ear: No hemotympanum.  Mouth/Throat: Oropharynx is clear and moist.  Eyes: EOM are normal. Pupils are equal, round, and reactive to light.  Neck: Normal range of motion.  Trachea is midline. No bruits.  Cardiovascular: Normal rate, regular rhythm, normal heart sounds and intact distal pulses.   No murmur heard. Pulmonary/Chest: Effort normal and breath sounds normal. No stridor. No respiratory distress.  Abdominal: Soft. Bowel sounds are normal. He exhibits no distension. There is no tenderness.  Musculoskeletal: Normal range of motion.  Neurological: He is alert and oriented to person, place, and time. He has normal reflexes.  Skin: Skin is warm and dry. Capillary refill takes less than 2 seconds.  Psychiatric: He has a normal mood and affect. Judgment normal.  Nursing note and vitals reviewed.    ED Treatments / Results   DIAGNOSTIC STUDIES: Oxygen Saturation is 96% on RA, Adequate by my interpretation.    COORDINATION OF CARE: 11:37 PM- Will order imaging and bloodwork. Discussed treatment plan with pt at bedside and pt agreed to plan.     12:42 AM- Spoke with Dr. Berenice Primas from orthopedics who will admit the patient.    Vitals:   11/23/15 2307  BP: (!) 174/101  Pulse: 73  Resp: 20  Temp: 97.7 F (36.5 C)   Results for orders placed or performed during the hospital encounter of 11/23/15  CBC with Differential/Platelet  Result Value Ref Range   WBC 16.4 (H) 4.0 - 10.5 K/uL   RBC 4.91 4.22 - 5.81 MIL/uL    Hemoglobin 15.9 13.0 - 17.0 g/dL   HCT 43.7 39.0 - 52.0 %   MCV 89.0 78.0 - 100.0 fL   MCH 32.4 26.0 - 34.0 pg   MCHC 36.4 (H) 30.0 - 36.0 g/dL   RDW 13.3 11.5 - 15.5 %   Platelets 174 150 - 400 K/uL   Neutrophils Relative % PENDING %   Neutro Abs PENDING 1.7 - 7.7 K/uL   Band Neutrophils PENDING %   Lymphocytes Relative PENDING %   Lymphs Abs PENDING 0.7 - 4.0 K/uL   Monocytes Relative PENDING %   Monocytes Absolute PENDING 0.1 - 1.0 K/uL   Eosinophils Relative PENDING %   Eosinophils Absolute PENDING 0.0 - 0.7 K/uL   Basophils Relative PENDING %   Basophils Absolute PENDING 0.0 - 0.1 K/uL   WBC Morphology PENDING  RBC Morphology PENDING    Smear Review PENDING    nRBC PENDING 0 /100 WBC   Metamyelocytes Relative PENDING %   Myelocytes PENDING %   Promyelocytes Absolute PENDING %   Blasts PENDING %   Dg Chest 1 View  Result Date: 11/23/2015 CLINICAL DATA:  Patient fell at his home this pm, landed on buttocks, posterior hip pain EXAM: CHEST 1 VIEW COMPARISON:  None. FINDINGS: Shallow inspiration. Heart size and pulmonary vascularity are normal. No focal airspace disease or consolidation in the lungs. Mild peribronchial thickening suggesting chronic bronchitis. No blunting of costophrenic angles. No pneumothorax. Mediastinal contours appear intact. Probable old fracture deformity of the left proximal humerus. IMPRESSION: Mild chronic bronchitic changes in the lungs. No evidence of active pulmonary disease. Electronically Signed   By: Lucienne Capers M.D.   On: 11/23/2015 23:43   Ct Head Wo Contrast  Result Date: 11/24/2015 CLINICAL DATA:  Initial evaluation for acute trauma, fall the EXAM: CT HEAD WITHOUT CONTRAST CT CERVICAL SPINE WITHOUT CONTRAST TECHNIQUE: Multidetector CT imaging of the head and cervical spine was performed following the standard protocol without intravenous contrast. Multiplanar CT image reconstructions of the cervical spine were also generated. COMPARISON:   None. FINDINGS: CT HEAD FINDINGS Brain: Age appropriate cerebral atrophy present. Mild chronic microvascular ischemic disease. Extensive encephalomalacia within the right parietotemporal region, compatible with remote right MCA territory infarct. Scattered remote bilateral basal ganglia lacunar infarcts present as well, right greater than left. No acute intracranial hemorrhage. No evidence for acute large vessel territory infarct. No mass lesion, midline shift, or mass effect. Ex vacuo dilatation of the right lateral ventricle related to the remote right MCA territory infarct. No hydrocephalus. No extra-axial fluid collection. Vascular: Prominent vascular calcifications present within the carotid siphons and distal vertebral arteries. No hyperdense vessel. Skull: Small posterior scalp contusion. Scalp soft tissues otherwise within normal limits. Calvarium intact. Sinuses/Orbits: The globes and orbital soft tissues within normal limits. Minimal mucosal thickening within the left maxillary sinus. Paranasal sinuses are otherwise clear. Chronic opacification of the bilateral mastoid air cells noted, of doubtful acute clinical significance. Other: No other significant finding. CT CERVICAL SPINE FINDINGS Alignment: Vertebral bodies normally aligned with preservation of the normal cervical lordosis. No listhesis. Skull base and vertebrae: Visualized skullbase intact. Vertebral bodies intact without evidence for fracture. Soft tissues and spinal canal: Paraspinous soft tissues demonstrate no acute abnormality. Prominent vascular calcifications noted about the carotid bifurcations. 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 Upper chest: Visualized lung apices are clear.  No pneumothorax. Other: No other significant finding. IMPRESSION: CT BRAIN: 1. No acute intracranial process. 2. Small posterior scalp contusion.  No skull fracture. 3. Remote right MCA territory infarct, with additional remote lacunar  infarcts involving the bilateral basal ganglia. CT CERVICAL SPINE: 1. No acute traumatic injury within the cervical spine. Electronically Signed   By: Jeannine Boga M.D.   On: 11/24/2015 00:00   Ct Cervical Spine Wo Contrast  Result Date: 11/24/2015 CLINICAL DATA:  Initial evaluation for acute trauma, fall the EXAM: CT HEAD WITHOUT CONTRAST CT CERVICAL SPINE WITHOUT CONTRAST TECHNIQUE: Multidetector CT imaging of the head and cervical spine was performed following the standard protocol without intravenous contrast. Multiplanar CT image reconstructions of the cervical spine were also generated. COMPARISON:  None. FINDINGS: CT HEAD FINDINGS Brain: Age appropriate cerebral atrophy present. Mild chronic microvascular ischemic disease. Extensive encephalomalacia within the right parietotemporal region, compatible with remote right MCA territory infarct. Scattered remote bilateral basal  ganglia lacunar infarcts present as well, right greater than left. No acute intracranial hemorrhage. No evidence for acute large vessel territory infarct. No mass lesion, midline shift, or mass effect. Ex vacuo dilatation of the right lateral ventricle related to the remote right MCA territory infarct. No hydrocephalus. No extra-axial fluid collection. Vascular: Prominent vascular calcifications present within the carotid siphons and distal vertebral arteries. No hyperdense vessel. Skull: Small posterior scalp contusion. Scalp soft tissues otherwise within normal limits. Calvarium intact. Sinuses/Orbits: The globes and orbital soft tissues within normal limits. Minimal mucosal thickening within the left maxillary sinus. Paranasal sinuses are otherwise clear. Chronic opacification of the bilateral mastoid air cells noted, of doubtful acute clinical significance. Other: No other significant finding. CT CERVICAL SPINE FINDINGS Alignment: Vertebral bodies normally aligned with preservation of the normal cervical lordosis. No  listhesis. Skull base and vertebrae: Visualized skullbase intact. Vertebral bodies intact without evidence for fracture. Soft tissues and spinal canal: Paraspinous soft tissues demonstrate no acute abnormality. Prominent vascular calcifications noted about the carotid bifurcations. 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 Upper chest: Visualized lung apices are clear.  No pneumothorax. Other: No other significant finding. IMPRESSION: CT BRAIN: 1. No acute intracranial process. 2. Small posterior scalp contusion.  No skull fracture. 3. Remote right MCA territory infarct, with additional remote lacunar infarcts involving the bilateral basal ganglia. CT CERVICAL SPINE: 1. No acute traumatic injury within the cervical spine. Electronically Signed   By: Jeannine Boga M.D.   On: 11/24/2015 00:00   Dg Hip Unilat W Or Wo Pelvis 2-3 Views Left  Result Date: 11/23/2015 CLINICAL DATA:  Initial evaluation for acute trauma, fall. EXAM: DG HIP (WITH OR WITHOUT PELVIS) 2-3V LEFT COMPARISON:  None. FINDINGS: There is an acute transverse fracture through the subcapital region of the left femoral neck with slight superior subluxation. Femoral head remains normally aligned within the acetabulum. Remainder the bony pelvis intact. SI joints approximated. No acute abnormality about the right hip. Degenerative changes noted within the lower lumbar spine. Prominent vascular calcifications noted. IMPRESSION: Acute fracture through the left femoral neck with slight superior subluxation. Electronically Signed   By: Jeannine Boga M.D.   On: 11/23/2015 23:35    Medications  fentaNYL (SUBLIMAZE) injection 50 mcg (not administered)    Radiology No results found.  Procedures Procedures (including critical care time)  Medications Ordered in ED Medications - No data to display   Initial Impression / Assessment and Plan / ED Course  I have reviewed the triage vital signs and the nursing  notes.  Pertinent labs & imaging results that were available during my care of the patient were reviewed by me and considered in my medical decision making (see chart for details).   Final Clinical Impressions(s) / ED Diagnoses   Final diagnoses:  None    EKG Interpretation  Date/Time:  Thursday November 24 2015 02:10:48 EDT Ventricular Rate:  85 PR Interval:    QRS Duration: 105 QT Interval:  393 QTC Calculation: 468 R Axis:   55 Text Interpretation:  Sinus rhythm Confirmed by [facility]  MD, Emmaline Kluver ([id number]) on 11/24/2015 2:15:12 AM       New Prescriptions New Prescriptions   No medications on file   I personally performed the services described in this documentation, which was scribed in my presence. The recorded information has been reviewed and is accurate.      Veatrice Kells, MD 11/24/15 0045    Etna Forquer, MD 11/24/15 [id number]

## 2015-11-23 NOTE — ED Triage Notes (Signed)
Per [facility], pt coming from [facility], fell from a standing position, c/o left hip pain, no shrtening, swelling posteriorly, normal movement & sensation, pt denies hitting head, attempted x 1 to iniiitiate IV without success.

## 2015-11-24 ENCOUNTER — Encounter (HOSPITAL_COMMUNITY): Payer: Self-pay | Admitting: Emergency Medicine

## 2015-11-24 ENCOUNTER — Encounter (HOSPITAL_COMMUNITY)
Admission: EM | Disposition: A | Discharge status: Skilled Nursing Facility | Payer: Self-pay | Source: Home / Self Care | Attending: Family Medicine

## 2015-11-24 ENCOUNTER — Inpatient Hospital Stay (HOSPITAL_COMMUNITY): Payer: Medicare Other | Admitting: Certified Registered Nurse Anesthetist

## 2015-11-24 ENCOUNTER — Inpatient Hospital Stay: Admit: 2015-11-24 | Payer: Medicare Other | Admitting: Orthopedic Surgery

## 2015-11-24 ENCOUNTER — Inpatient Hospital Stay (HOSPITAL_COMMUNITY): Payer: Medicare Other

## 2015-11-24 DIAGNOSIS — I6789 Other cerebrovascular disease: Secondary | ICD-10-CM

## 2015-11-24 DIAGNOSIS — W1830XA Fall on same level, unspecified, initial encounter: Secondary | ICD-10-CM | POA: Diagnosis present

## 2015-11-24 DIAGNOSIS — I10 Essential (primary) hypertension: Secondary | ICD-10-CM

## 2015-11-24 DIAGNOSIS — M25552 Pain in left hip: Secondary | ICD-10-CM | POA: Diagnosis present

## 2015-11-24 DIAGNOSIS — E7800 Pure hypercholesterolemia, unspecified: Secondary | ICD-10-CM | POA: Diagnosis present

## 2015-11-24 DIAGNOSIS — K219 Gastro-esophageal reflux disease without esophagitis: Secondary | ICD-10-CM

## 2015-11-24 DIAGNOSIS — S72002A Fracture of unspecified part of neck of left femur, initial encounter for closed fracture: Secondary | ICD-10-CM | POA: Diagnosis not present

## 2015-11-24 DIAGNOSIS — G459 Transient cerebral ischemic attack, unspecified: Secondary | ICD-10-CM | POA: Diagnosis present

## 2015-11-24 DIAGNOSIS — S72012A Unspecified intracapsular fracture of left femur, initial encounter for closed fracture: Secondary | ICD-10-CM | POA: Diagnosis present

## 2015-11-24 DIAGNOSIS — Z8673 Personal history of transient ischemic attack (TIA), and cerebral infarction without residual deficits: Secondary | ICD-10-CM | POA: Diagnosis not present

## 2015-11-24 DIAGNOSIS — W19XXXA Unspecified fall, initial encounter: Secondary | ICD-10-CM | POA: Diagnosis present

## 2015-11-24 DIAGNOSIS — D62 Acute posthemorrhagic anemia: Secondary | ICD-10-CM | POA: Diagnosis not present

## 2015-11-24 DIAGNOSIS — D649 Anemia, unspecified: Secondary | ICD-10-CM | POA: Diagnosis not present

## 2015-11-24 DIAGNOSIS — S72002D Fracture of unspecified part of neck of left femur, subsequent encounter for closed fracture with routine healing: Secondary | ICD-10-CM | POA: Diagnosis not present

## 2015-11-24 DIAGNOSIS — Z87891 Personal history of nicotine dependence: Secondary | ICD-10-CM | POA: Diagnosis not present

## 2015-11-24 DIAGNOSIS — D72829 Elevated white blood cell count, unspecified: Secondary | ICD-10-CM | POA: Diagnosis present

## 2015-11-24 DIAGNOSIS — Z7982 Long term (current) use of aspirin: Secondary | ICD-10-CM | POA: Diagnosis not present

## 2015-11-24 HISTORY — PX: ANTERIOR APPROACH HEMI HIP ARTHROPLASTY: SHX6690

## 2015-11-24 LAB — CBC WITH DIFFERENTIAL/PLATELET
BASOS PCT: 0 %
Basophils Absolute: 0 10*3/uL (ref 0.0–0.1)
Eosinophils Absolute: 0 10*3/uL (ref 0.0–0.7)
Eosinophils Relative: 0 %
HCT: 43.7 % (ref 39.0–52.0)
Hemoglobin: 15.9 g/dL (ref 13.0–17.0)
LYMPHS PCT: 11 %
Lymphs Abs: 1.8 10*3/uL (ref 0.7–4.0)
MCH: 32.4 pg (ref 26.0–34.0)
MCHC: 36.4 g/dL — AB (ref 30.0–36.0)
MCV: 89 fL (ref 78.0–100.0)
Monocytes Absolute: 0.8 10*3/uL (ref 0.1–1.0)
Monocytes Relative: 5 %
Neutro Abs: 13.8 10*3/uL — ABNORMAL HIGH (ref 1.7–7.7)
Neutrophils Relative %: 84 %
Platelets: 174 10*3/uL (ref 150–400)
RBC: 4.91 MIL/uL (ref 4.22–5.81)
RDW: 13.3 % (ref 11.5–15.5)
WBC: 16.4 10*3/uL — ABNORMAL HIGH (ref 4.0–10.5)

## 2015-11-24 LAB — BASIC METABOLIC PANEL
ANION GAP: 9 (ref 5–15)
BUN: 22 mg/dL — ABNORMAL HIGH (ref 6–20)
CHLORIDE: 105 mmol/L (ref 101–111)
CO2: 23 mmol/L (ref 22–32)
CREATININE: 1.02 mg/dL (ref 0.61–1.24)
Calcium: 9.1 mg/dL (ref 8.9–10.3)
GFR calc non Af Amer: >60 mL/min (ref >60)
Glucose, Bld: 134 mg/dL — ABNORMAL HIGH (ref 65–99)
POTASSIUM: 4.2 mmol/L (ref 3.5–5.1)
Sodium: 137 mmol/L (ref 135–145)

## 2015-11-24 LAB — CBC
HEMATOCRIT: 45.5 % (ref 39.0–52.0)
HEMOGLOBIN: 15.9 g/dL (ref 13.0–17.0)
MCH: 31.9 pg (ref 26.0–34.0)
MCHC: 34.9 g/dL (ref 30.0–36.0)
MCV: 91.2 fL (ref 78.0–100.0)
Platelets: 143 10*3/uL — ABNORMAL LOW (ref 150–400)
RBC: 4.99 MIL/uL (ref 4.22–5.81)
RDW: 13.4 % (ref 11.5–15.5)
WBC: 19.6 10*3/uL — ABNORMAL HIGH (ref 4.0–10.5)

## 2015-11-24 LAB — ABO/RH
ABO/RH(D): O POS
ABO/RH(D): O POS

## 2015-11-24 LAB — APTT: APTT: 32 s (ref 24–36)

## 2015-11-24 LAB — TYPE AND SCREEN
ABO/RH(D): O POS
ABO/RH(D): O POS
Antibody Screen: NEGATIVE
Antibody Screen: NEGATIVE

## 2015-11-24 LAB — PROTIME-INR
INR: 1.15
PROTHROMBIN TIME: 14.8 s (ref 11.4–15.2)

## 2015-11-24 LAB — SURGICAL PCR SCREEN
MRSA, PCR: NEGATIVE
STAPHYLOCOCCUS AUREUS: NEGATIVE

## 2015-11-24 SURGERY — HEMIARTHROPLASTY, HIP, DIRECT ANTERIOR APPROACH, FOR FRACTURE
Anesthesia: General | Site: Hip | Laterality: Left

## 2015-11-24 MED ORDER — PHENYLEPHRINE HCL 10 MG/ML IJ SOLN
INTRAMUSCULAR | Status: DC | PRN
  Administered 2015-11-24 (×5): 80 ug via INTRAVENOUS

## 2015-11-24 MED ORDER — CEFAZOLIN SODIUM-DEXTROSE 2-4 GM/100ML-% IV SOLN
INTRAVENOUS | Status: AC
  Filled 2015-11-24: qty 100

## 2015-11-24 MED ORDER — SODIUM CHLORIDE 0.9 % IV SOLN
INTRAVENOUS | Status: DC
  Administered 2015-11-24: 18:00:00 via INTRAVENOUS
  Administered 2015-11-24: 1000 mL via INTRAVENOUS
  Administered 2015-11-25: 21:00:00 via INTRAVENOUS

## 2015-11-24 MED ORDER — ADULT MULTIVITAMIN W/MINERALS CH
1.0000 | ORAL_TABLET | Freq: Every day | ORAL | Status: DC
Start: 2015-11-24 — End: 2015-11-27
  Administered 2015-11-25 – 2015-11-27 (×3): 1 via ORAL
  Filled 2015-11-24 (×3): qty 1

## 2015-11-24 MED ORDER — ONDANSETRON HCL 4 MG/2ML IJ SOLN
4.0000 mg | Freq: Three times a day (TID) | INTRAMUSCULAR | Status: DC | PRN
Start: 2015-11-24 — End: 2015-11-27
  Administered 2015-11-24: 4 mg via INTRAVENOUS
  Filled 2015-11-24: qty 2

## 2015-11-24 MED ORDER — ASPIRIN EC 325 MG PO TBEC
325.0000 mg | DELAYED_RELEASE_TABLET | Freq: Two times a day (BID) | ORAL | Status: DC
  Administered 2015-11-24 – 2015-11-27 (×6): 325 mg via ORAL
  Filled 2015-11-24 (×6): qty 1

## 2015-11-24 MED ORDER — HYDROCODONE-ACETAMINOPHEN 5-325 MG PO TABS
1.0000 | ORAL_TABLET | ORAL | Status: DC | PRN
  Administered 2015-11-24 – 2015-11-26 (×5): 1 via ORAL
  Filled 2015-11-24 (×6): qty 1

## 2015-11-24 MED ORDER — ONDANSETRON HCL 4 MG PO TABS: 4.0000 mg | ORAL_TABLET | Freq: Four times a day (QID) | ORAL | Status: DC | PRN

## 2015-11-24 MED ORDER — ROCURONIUM BROMIDE 100 MG/10ML IV SOLN
INTRAVENOUS | Status: DC | PRN
  Administered 2015-11-24: 50 mg via INTRAVENOUS
  Administered 2015-11-24: 30 mg via INTRAVENOUS

## 2015-11-24 MED ORDER — VITAMIN B-1 100 MG PO TABS
100.0000 mg | ORAL_TABLET | Freq: Every day | ORAL | Status: DC
Start: 2015-11-24 — End: 2015-11-27
  Administered 2015-11-25 – 2015-11-27 (×3): 100 mg via ORAL
  Filled 2015-11-24 (×3): qty 1

## 2015-11-24 MED ORDER — PROPOFOL 10 MG/ML IV BOLUS
INTRAVENOUS | Status: AC
  Filled 2015-11-24: qty 20

## 2015-11-24 MED ORDER — METHOCARBAMOL 500 MG PO TABS
500.0000 mg | ORAL_TABLET | Freq: Four times a day (QID) | ORAL | Status: DC | PRN
  Administered 2015-11-25 (×2): 500 mg via ORAL
  Filled 2015-11-24 (×2): qty 1

## 2015-11-24 MED ORDER — HYDRALAZINE HCL 20 MG/ML IJ SOLN: 5.0000 mg | INTRAMUSCULAR | Status: DC | PRN

## 2015-11-24 MED ORDER — PROPOFOL 10 MG/ML IV BOLUS
INTRAVENOUS | Status: DC | PRN
  Administered 2015-11-24: 180 mg via INTRAVENOUS

## 2015-11-24 MED ORDER — LACTATED RINGERS IV SOLN
INTRAVENOUS | Status: DC
  Administered 2015-11-24: 12:00:00 via INTRAVENOUS

## 2015-11-24 MED ORDER — FERROUS SULFATE 325 (65 FE) MG PO TABS
325.0000 mg | ORAL_TABLET | Freq: Two times a day (BID) | ORAL | Status: DC
  Administered 2015-11-24 – 2015-11-27 (×6): 325 mg via ORAL
  Filled 2015-11-24 (×6): qty 1

## 2015-11-24 MED ORDER — BUPIVACAINE HCL 0.5 % IJ SOLN
INTRAMUSCULAR | Status: DC | PRN
  Administered 2015-11-24: 20 mL

## 2015-11-24 MED ORDER — VITAMIN D 1000 UNITS PO TABS
4000.0000 [IU] | ORAL_TABLET | Freq: Every day | ORAL | Status: DC
  Administered 2015-11-25 – 2015-11-27 (×3): 4000 [IU] via ORAL
  Filled 2015-11-24 (×3): qty 4

## 2015-11-24 MED ORDER — FENTANYL CITRATE (PF) 100 MCG/2ML IJ SOLN
INTRAMUSCULAR | Status: AC
  Filled 2015-11-24: qty 2

## 2015-11-24 MED ORDER — FENTANYL CITRATE (PF) 100 MCG/2ML IJ SOLN
INTRAMUSCULAR | Status: AC
  Filled 2015-11-24: qty 4

## 2015-11-24 MED ORDER — ATORVASTATIN CALCIUM 80 MG PO TABS
80.0000 mg | ORAL_TABLET | Freq: Every day | ORAL | Status: DC
  Administered 2015-11-24 – 2015-11-26 (×3): 80 mg via ORAL
  Filled 2015-11-24 (×4): qty 1

## 2015-11-24 MED ORDER — POVIDONE-IODINE 10 % EX SWAB
2.0000 | Freq: Once | CUTANEOUS | Status: DC
Start: 2015-11-24 — End: 2015-11-24

## 2015-11-24 MED ORDER — BUPIVACAINE HCL (PF) 0.5 % IJ SOLN
INTRAMUSCULAR | Status: AC
  Filled 2015-11-24: qty 30

## 2015-11-24 MED ORDER — ACETAMINOPHEN 325 MG PO TABS
650.0000 mg | ORAL_TABLET | Freq: Four times a day (QID) | ORAL | Status: DC | PRN
  Administered 2015-11-27: 650 mg via ORAL
  Filled 2015-11-24: qty 2

## 2015-11-24 MED ORDER — TRANEXAMIC ACID 1000 MG/10ML IV SOLN
1000.0000 mg | INTRAVENOUS | Status: AC
  Administered 2015-11-24: 1000 mg via INTRAVENOUS
  Filled 2015-11-24: qty 10

## 2015-11-24 MED ORDER — POLYETHYLENE GLYCOL 3350 17 G PO PACK
17.0000 g | PACK | Freq: Every day | ORAL | Status: DC | PRN
  Administered 2015-11-27: 17 g via ORAL
  Filled 2015-11-24: qty 1

## 2015-11-24 MED ORDER — TRANEXAMIC ACID 1000 MG/10ML IV SOLN
1000.0000 mg | INTRAVENOUS | Status: DC
  Filled 2015-11-24: qty 10

## 2015-11-24 MED ORDER — LACTATED RINGERS IV SOLN
INTRAVENOUS | Status: DC | PRN
  Administered 2015-11-24 (×3): via INTRAVENOUS

## 2015-11-24 MED ORDER — CHLORHEXIDINE GLUCONATE 4 % EX LIQD: 60.0000 mL | Freq: Once | CUTANEOUS | Status: DC

## 2015-11-24 MED ORDER — METOPROLOL TARTRATE 25 MG PO TABS
25.0000 mg | ORAL_TABLET | Freq: Two times a day (BID) | ORAL | Status: DC
  Administered 2015-11-24 – 2015-11-27 (×6): 25 mg via ORAL
  Filled 2015-11-24 (×3): qty 1
  Filled 2015-11-24: qty 2
  Filled 2015-11-24 (×2): qty 1

## 2015-11-24 MED ORDER — FLUTICASONE PROPIONATE 50 MCG/ACT NA SUSP
1.0000 | Freq: Every day | NASAL | Status: DC
Start: 2015-11-24 — End: 2015-11-27
  Filled 2015-11-24 (×2): qty 16

## 2015-11-24 MED ORDER — LORATADINE 10 MG PO TABS
10.0000 mg | ORAL_TABLET | Freq: Every day | ORAL | Status: DC
  Administered 2015-11-25 – 2015-11-27 (×3): 10 mg via ORAL
  Filled 2015-11-24 (×3): qty 1

## 2015-11-24 MED ORDER — ZOLPIDEM TARTRATE 5 MG PO TABS: 5.0000 mg | ORAL_TABLET | Freq: Every evening | ORAL | Status: DC | PRN

## 2015-11-24 MED ORDER — LIDOCAINE HCL (CARDIAC) 20 MG/ML IV SOLN
INTRAVENOUS | Status: DC | PRN
  Administered 2015-11-24: 100 mg via INTRAVENOUS

## 2015-11-24 MED ORDER — MIDAZOLAM HCL 5 MG/5ML IJ SOLN
INTRAMUSCULAR | Status: DC | PRN
  Administered 2015-11-24: 2 mg via INTRAVENOUS

## 2015-11-24 MED ORDER — ALBUMIN HUMAN 5 % IV SOLN
INTRAVENOUS | Status: DC | PRN
  Administered 2015-11-24: 14:00:00 via INTRAVENOUS

## 2015-11-24 MED ORDER — SENNA 8.6 MG PO TABS
1.0000 | ORAL_TABLET | Freq: Every day | ORAL | Status: DC
  Administered 2015-11-25 – 2015-11-27 (×2): 8.6 mg via ORAL
  Filled 2015-11-24 (×2): qty 1

## 2015-11-24 MED ORDER — ACETAMINOPHEN 10 MG/ML IV SOLN
INTRAVENOUS | Status: DC | PRN
  Administered 2015-11-24: 1000 mg via INTRAVENOUS

## 2015-11-24 MED ORDER — DEXAMETHASONE SODIUM PHOSPHATE 4 MG/ML IJ SOLN
INTRAMUSCULAR | Status: DC | PRN
  Administered 2015-11-24: 10 mg via INTRAVENOUS

## 2015-11-24 MED ORDER — METHOCARBAMOL 500 MG PO TABS: 500.0000 mg | ORAL_TABLET | Freq: Three times a day (TID) | ORAL | Status: DC | PRN

## 2015-11-24 MED ORDER — ONDANSETRON HCL 4 MG/2ML IJ SOLN: 4.0000 mg | Freq: Four times a day (QID) | INTRAMUSCULAR | Status: DC | PRN

## 2015-11-24 MED ORDER — ACETAMINOPHEN 10 MG/ML IV SOLN
INTRAVENOUS | Status: AC
  Filled 2015-11-24: qty 100

## 2015-11-24 MED ORDER — CEFAZOLIN SODIUM-DEXTROSE 2-4 GM/100ML-% IV SOLN
2.0000 g | Freq: Four times a day (QID) | INTRAVENOUS | Status: AC
  Administered 2015-11-24 – 2015-11-25 (×2): 2 g via INTRAVENOUS
  Filled 2015-11-24 (×2): qty 100

## 2015-11-24 MED ORDER — BUPIVACAINE LIPOSOME 1.3 % IJ SUSP
20.0000 mL | INTRAMUSCULAR | Status: AC
  Administered 2015-11-24: 20 mL
  Filled 2015-11-24: qty 20

## 2015-11-24 MED ORDER — SODIUM CHLORIDE 0.9 % IV SOLN
1000.0000 mg | Freq: Once | INTRAVENOUS | Status: AC
  Administered 2015-11-24: 1000 mg via INTRAVENOUS
  Filled 2015-11-24: qty 10

## 2015-11-24 MED ORDER — ACETAMINOPHEN 650 MG RE SUPP: 650.0000 mg | Freq: Four times a day (QID) | RECTAL | Status: DC | PRN

## 2015-11-24 MED ORDER — SUGAMMADEX SODIUM 200 MG/2ML IV SOLN
INTRAVENOUS | Status: DC | PRN
  Administered 2015-11-24: 200 mg via INTRAVENOUS

## 2015-11-24 MED ORDER — FENTANYL CITRATE (PF) 100 MCG/2ML IJ SOLN
50.0000 ug | Freq: Once | INTRAMUSCULAR | Status: AC
  Administered 2015-11-24: 50 ug via INTRAVENOUS
  Filled 2015-11-24: qty 2

## 2015-11-24 MED ORDER — ASPIRIN EC 81 MG PO TBEC: 81.0000 mg | DELAYED_RELEASE_TABLET | Freq: Every day | ORAL | Status: DC

## 2015-11-24 MED ORDER — 0.9 % SODIUM CHLORIDE (POUR BTL) OPTIME
TOPICAL | Status: DC | PRN
  Administered 2015-11-24: 1000 mL

## 2015-11-24 MED ORDER — MIDAZOLAM HCL 2 MG/2ML IJ SOLN
INTRAMUSCULAR | Status: AC
  Filled 2015-11-24: qty 2

## 2015-11-24 MED ORDER — HYDROCODONE-ACETAMINOPHEN 5-325 MG PO TABS: 1.0000 | ORAL_TABLET | ORAL | 0 refills | Status: AC | PRN

## 2015-11-24 MED ORDER — FENTANYL CITRATE (PF) 100 MCG/2ML IJ SOLN
INTRAMUSCULAR | Status: DC | PRN
  Administered 2015-11-24 (×3): 50 ug via INTRAVENOUS
  Administered 2015-11-24: 100 ug via INTRAVENOUS

## 2015-11-24 MED ORDER — ONDANSETRON HCL 4 MG/2ML IJ SOLN
INTRAMUSCULAR | Status: DC | PRN
  Administered 2015-11-24: 4 mg via INTRAVENOUS

## 2015-11-24 MED ORDER — ASPIRIN EC 325 MG PO TBEC: 325.0000 mg | DELAYED_RELEASE_TABLET | Freq: Two times a day (BID) | ORAL | 0 refills | Status: AC

## 2015-11-24 MED ORDER — METHOCARBAMOL 1000 MG/10ML IJ SOLN
500.0000 mg | Freq: Four times a day (QID) | INTRAVENOUS | Status: DC | PRN
  Filled 2015-11-24: qty 5

## 2015-11-24 MED ORDER — CEFAZOLIN SODIUM-DEXTROSE 2-4 GM/100ML-% IV SOLN
2.0000 g | INTRAVENOUS | Status: AC
  Administered 2015-11-24: 2 g via INTRAVENOUS

## 2015-11-24 MED ORDER — ALUM & MAG HYDROXIDE-SIMETH 200-200-20 MG/5ML PO SUSP: 30.0000 mL | ORAL | Status: DC | PRN

## 2015-11-24 MED ORDER — MORPHINE SULFATE (PF) 2 MG/ML IV SOLN
2.0000 mg | INTRAVENOUS | Status: DC | PRN
  Administered 2015-11-24: 2 mg via INTRAVENOUS
  Filled 2015-11-24: qty 1

## 2015-11-24 SURGICAL SUPPLY — 64 items
BENZOIN TINCTURE PRP APPL 2/3 (GAUZE/BANDAGES/DRESSINGS) ×3 IMPLANT
BLADE SAW SGTL 18X1.27X75 (BLADE) ×2 IMPLANT
BLADE SAW SGTL 18X1.27X75MM (BLADE) ×1
BLADE SURG ROTATE 9660 (MISCELLANEOUS) ×3 IMPLANT
BNDG COHESIVE 6X5 TAN STRL LF (GAUZE/BANDAGES/DRESSINGS) IMPLANT
BNDG GAUZE ELAST 4 BULKY (GAUZE/BANDAGES/DRESSINGS) IMPLANT
CAPT HIP HEMI 2 ×3 IMPLANT
CELLS DAT CNTRL 66122 CELL SVR (MISCELLANEOUS) ×1 IMPLANT
CLOSURE STERI-STRIP 1/2X4 (GAUZE/BANDAGES/DRESSINGS)
CLOSURE WOUND 1/2 X4 (GAUZE/BANDAGES/DRESSINGS) ×1
CLSR STERI-STRIP ANTIMIC 1/2X4 (GAUZE/BANDAGES/DRESSINGS) IMPLANT
COVER PERINEAL POST (MISCELLANEOUS) ×3 IMPLANT
COVER SURGICAL LIGHT HANDLE (MISCELLANEOUS) ×3 IMPLANT
DRAPE C-ARM 42X72 X-RAY (DRAPES) ×3 IMPLANT
DRAPE C-ARMOR (DRAPES) IMPLANT
DRAPE INCISE IOBAN 66X45 STRL (DRAPES) ×3 IMPLANT
DRAPE STERI IOBAN 125X83 (DRAPES) ×3 IMPLANT
DRAPE U-SHAPE 47X51 STRL (DRAPES) ×6 IMPLANT
DRSG AQUACEL AG ADV 3.5X10 (GAUZE/BANDAGES/DRESSINGS) ×3 IMPLANT
DURAPREP 26ML APPLICATOR (WOUND CARE) ×3 IMPLANT
ELECT BLADE 4.0 EZ CLEAN MEGAD (MISCELLANEOUS) ×3
ELECT CAUTERY BLADE 6.4 (BLADE) ×3 IMPLANT
ELECT REM PT RETURN 9FT ADLT (ELECTROSURGICAL) ×3
ELECTRODE BLDE 4.0 EZ CLN MEGD (MISCELLANEOUS) ×1 IMPLANT
ELECTRODE REM PT RTRN 9FT ADLT (ELECTROSURGICAL) ×1 IMPLANT
GAUZE XEROFORM 1X8 LF (GAUZE/BANDAGES/DRESSINGS) IMPLANT
GLOVE BIO SURGEON STRL SZ7.5 (GLOVE) IMPLANT
GLOVE BIOGEL PI IND STRL 7.0 (GLOVE) ×1 IMPLANT
GLOVE BIOGEL PI IND STRL 8 (GLOVE) ×2 IMPLANT
GLOVE BIOGEL PI INDICATOR 7.0 (GLOVE) ×2
GLOVE BIOGEL PI INDICATOR 8 (GLOVE) ×4
GLOVE ECLIPSE 7.5 STRL STRAW (GLOVE) ×6 IMPLANT
GLOVE SURG SS PI 6.5 STRL IVOR (GLOVE) ×3 IMPLANT
GOWN STRL REUS W/ TWL LRG LVL3 (GOWN DISPOSABLE) ×3 IMPLANT
GOWN STRL REUS W/ TWL XL LVL3 (GOWN DISPOSABLE) ×2 IMPLANT
GOWN STRL REUS W/TWL LRG LVL3 (GOWN DISPOSABLE) ×6
GOWN STRL REUS W/TWL XL LVL3 (GOWN DISPOSABLE) ×4
HOOD PEEL AWAY FACE SHEILD DIS (HOOD) ×6 IMPLANT
KIT BASIN OR (CUSTOM PROCEDURE TRAY) ×3 IMPLANT
KIT ROOM TURNOVER OR (KITS) ×3 IMPLANT
MANIFOLD NEPTUNE II (INSTRUMENTS) ×3 IMPLANT
NEEDLE SPNL 22GX3.5 QUINCKE BK (NEEDLE) ×3 IMPLANT
NS IRRIG 1000ML POUR BTL (IV SOLUTION) ×3 IMPLANT
PACK ORTHO EXTREMITY (CUSTOM PROCEDURE TRAY) ×3 IMPLANT
PACK TOTAL JOINT (CUSTOM PROCEDURE TRAY) ×3 IMPLANT
PACK UNIVERSAL I (CUSTOM PROCEDURE TRAY) ×3 IMPLANT
PAD ARMBOARD 7.5X6 YLW CONV (MISCELLANEOUS) ×6 IMPLANT
RTRCTR WOUND ALEXIS 18CM MED (MISCELLANEOUS) ×3
RTRCTR WOUND ALEXIS 18CM SML (INSTRUMENTS)
SAVER CELL AAL HAEMONETICS (INSTRUMENTS) IMPLANT
SPONGE LAP 18X18 X RAY DECT (DISPOSABLE) ×6 IMPLANT
STAPLER VISISTAT 35W (STAPLE) ×3 IMPLANT
STRIP CLOSURE SKIN 1/2X4 (GAUZE/BANDAGES/DRESSINGS) ×2 IMPLANT
SUT ETHIBOND NAB CT1 #1 30IN (SUTURE) ×6 IMPLANT
SUT MNCRL AB 3-0 PS2 18 (SUTURE) IMPLANT
SUT VIC AB 0 CT1 27 (SUTURE) ×4
SUT VIC AB 0 CT1 27XBRD ANBCTR (SUTURE) ×2 IMPLANT
SUT VIC AB 1 CT1 27 (SUTURE) ×2
SUT VIC AB 1 CT1 27XBRD ANBCTR (SUTURE) ×1 IMPLANT
SUT VIC AB 2-0 CT1 27 (SUTURE) ×2
SUT VIC AB 2-0 CT1 TAPERPNT 27 (SUTURE) ×1 IMPLANT
SYR 50ML LL SCALE MARK (SYRINGE) ×3 IMPLANT
TOWEL OR 17X24 6PK STRL BLUE (TOWEL DISPOSABLE) ×3 IMPLANT
TOWEL OR 17X26 10 PK STRL BLUE (TOWEL DISPOSABLE) ×3 IMPLANT

## 2015-11-24 NOTE — Progress Notes (Signed)
OT Cancellation Note  Patient Details Name: Douglas Vargas MRN: [id number] DOB: Sep 19, 1950   Cancelled Treatment:    Reason Eval/Treat Not Completed: Other (comment).  Pt is in sx. Please reorder after this. Thank you  [name] 11/24/2015, 1:11 PM  [name], OTR/L [phone] 11/24/2015

## 2015-11-24 NOTE — Progress Notes (Signed)
Patient ID: Douglas Vargas, male   DOB: December 01, 1950, 65 y.o.   MRN: [id number] I have reviewed x-rays on this patient.  The x-rays show that he has displaced left femoral neck fracture.  I have done a consult note and pended it for convenience prior to my evaluating the patient.  I will see him later this morning and plan surgery later this afternoon.  He will need a hemiarthroplasty left hip.  He will be admitted and cleared via the internal medicine service and should be kept n.p.o. In anticipation of surgery later today.

## 2015-11-24 NOTE — Anesthesia Postprocedure Evaluation (Signed)
Anesthesia Post Note  Patient: Douglas Vargas  Procedure(s) Performed: Procedure(s) (LRB): ANTERIOR APPROACH HEMI HIP ARTHROPLASTY (Left)  Patient location during evaluation: PACU Anesthesia Type: General Level of consciousness: awake and alert Pain management: pain level controlled Vital Signs Assessment: post-procedure vital signs reviewed and stable Respiratory status: spontaneous breathing, nonlabored ventilation, respiratory function stable and patient connected to nasal cannula oxygen Cardiovascular status: blood pressure returned to baseline and stable Postop Assessment: no signs of nausea or vomiting Anesthetic complications: no    Last Vitals:  Vitals:   11/24/15 1153 11/24/15 1409  BP: (!) 157/73 (!) 160/70  Pulse:    Resp:  15  Temp:  36.1 C    Last Pain:  Vitals:   11/24/15 1014  TempSrc:   PainSc: 2                  Nilda Simmer
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
## 2015-11-24 NOTE — Progress Notes (Signed)
PT Cancellation Note  Patient Details Name: Douglas Vargas MRN: [id number] DOB: [birth year]   Cancelled Treatment:    Reason Eval/Treat Not Completed: Medical issues which prohibited therapy Pt has not yet had surgery for hip fracture.  Please reorder after surgery. Thanks.   Koa Zoeller,KATHrine E 11/24/2015, 8:36 AM Carmelia Bake, PT, DPT 11/24/2015 Pager: [phone]

## 2015-11-24 NOTE — Anesthesia Preprocedure Evaluation (Addendum)
Anesthesia Evaluation  Patient identified by MRN, date of birth, ID band Patient awake    Reviewed: Allergy & Precautions, NPO status , Patient's Chart, lab work & pertinent test results, reviewed documented beta blocker date and time   History of Anesthesia Complications Negative for: history of anesthetic complications  Airway Mallampati: III  TM Distance: >3 FB Neck ROM: Full    Dental  (+) Missing, Dental Advisory Given   Pulmonary neg shortness of breath, neg sleep apnea, neg COPD, neg recent URI, former smoker,    breath sounds clear to auscultation       Cardiovascular hypertension, Pt. on medications and Pt. on home beta blockers (-) angina(-) Past MI and (-) Cardiac Stents  Rhythm:Regular Rate:Normal  HLD   Neuro/Psych neg Seizures H/o subdural hematoma TIA   GI/Hepatic GERD  Controlled,(+)     substance abuse  alcohol use, Adynamic ileus   Endo/Other  negative endocrine ROS  Renal/GU negative Renal ROS     Musculoskeletal   Abdominal   Peds  Hematology  (+) Blood dyscrasia, anemia ,   Anesthesia Other Findings   Reproductive/Obstetrics                            Anesthesia Physical Anesthesia Plan  ASA: III  Anesthesia Plan: General   Post-op Pain Management:    Induction: Intravenous  Airway Management Planned: Oral ETT  Additional Equipment:   Intra-op Plan:   Post-operative Plan: Extubation in OR  Informed Consent: I have reviewed the patients History and Physical, chart, labs and discussed the procedure including the risks, benefits and alternatives for the proposed anesthesia with the patient or authorized representative who has indicated his/her understanding and acceptance.   Dental advisory given  Plan Discussed with: CRNA  Anesthesia Plan Comments: (Risks of general anesthesia discussed including, but not limited to, sore throat, hoarse voice,  chipped/damaged teeth, injury to vocal cords, nausea and vomiting, allergic reactions, lung infection, heart attack, stroke, and death. All questions answered. )       Anesthesia Quick Evaluation

## 2015-11-24 NOTE — Progress Notes (Signed)
PROGRESS NOTE  Douglas Vargas  [id number] DOB: [birth year] DOA: 11/23/2015 PCP: No primary care provider on file.  Brief Narrative:   Douglas Vargas is a 65 y.o. male with medical history significant of hypertension, hyperlipidemia, SDH, TIA, GERD, who presented with left hip pain after mechanical fall.  X-ray of left hip confirmed left femoral neck fracture. Transferred to [facility] on 11/24/2015 for surgery at the request of the orthopedic surgeon.    Assessment & Plan:   Principal Problem:   Fracture of femoral neck, left, closed Active Problems:   HTN (hypertension)   GERD (gastroesophageal reflux disease)   Pure hypercholesterolemia   Fall   TIA (transient ischemic attack)   Leukocytosis  Closed fracture of left femoral neck: As evidenced by x-ray.  -  Tx to [facility] for surgery - Pain control: morphine prn and Norco - When necessary Zofran for nausea  Leukocytosis: Likely due to stress-induced demargination. Patient does not have signs of infection. -  UA and urine culture -  CXR was negative -  Trend WBC  HTN: -Continue metoprolol -IV hydralazine when necessary  HLD:  -Continue home medications: Lipitor  GERD: -Protonix  TIA (transient ischemic attack): No new issues. -Continue aspirin and Lipitor   DVT ppx: SCD Code Status: Full code Family Communication: None at bed side Disposition Plan:  likely to SNF  Consultants:   Dr. Berenice Primas, Orthopedic surgery  Procedures:  pendingi  Antimicrobials:   perioperative    Subjective: Comfortable at rest. Has severe pain in the left hip when he moves his left leg. Denies dyspnea, chest pain, nausea. Has not had anything to ED or drink this morning.  Objective: Vitals:   11/23/15 2307 11/24/15 0134 11/24/15 0236 11/24/15 1153  BP: (!) 174/101 141/63 (!) 167/81 (!) 157/73  Pulse: 73 78 87   Resp: 20 20 18    Temp: 97.7 F (36.5 C)  99.5 F (37.5 C)   TempSrc: Oral  Oral   SpO2: 96%  94% 95%   Weight: 92.1 kg (203 lb)  91.7 kg (202 lb 1.6 oz)   Height: 6' (1.829 m)  6' (1.829 m)     Intake/Output Summary (Last 24 hours) at 11/24/15 1315 Last data filed at 11/24/15 1306  Gross per 24 hour  Intake           1187.5 ml  Output              550 ml  Net            637.5 ml   Filed Weights   11/23/15 2307 11/24/15 0236  Weight: 92.1 kg (203 lb) 91.7 kg (202 lb 1.6 oz)    Examination:  General exam:  Adult Male.  No acute distress.  HEENT:  NCAT, MMM Respiratory system: Clear to auscultation bilaterally Cardiovascular system: Regular rate and rhythm, normal S1/S2. No murmurs, rubs, gallops or clicks.  Warm extremities Gastrointestinal system: Normal active bowel sounds, soft, nondistended, nontender. MSK:  Normal tone and bulk.  Left leg is externally rotated and foreshortened. 1+ pitting edema of the left lower extremity. Bruising along the left lateral hip. Tender to palpation along the left lateral hip.  2+ pedal pulse on the left leg, symmetric with 2+ pedal pulse on right Neuro:  Strength limited by pain left lower extremity. Sensation intact to light touch left lower extremity.    Data Reviewed: I have personally reviewed following labs and imaging studies  CBC:  Recent Labs  Lab 11/23/15 2342 11/24/15 0323  WBC 16.4* 19.6*  NEUTROABS 13.8*  --   HGB 15.9 15.9  HCT 43.7 45.5  MCV 89.0 91.2  PLT 174 [id number]*   Basic Metabolic Panel:  Recent Labs Lab 11/24/15 0323  NA 137  K 4.2  CL 105  CO2 23  GLUCOSE 134*  BUN 22*  CREATININE 1.02  CALCIUM 9.1   GFR: Estimated Creatinine Clearance: 77.1 mL/min (by C-G formula based on SCr of 1.02 mg/dL). Liver Function Tests: No results for input(s): AST, ALT, ALKPHOS, BILITOT, PROT, ALBUMIN in the last 168 hours. No results for input(s): LIPASE, AMYLASE in the last 168 hours. No results for input(s): AMMONIA in the last 168 hours. Coagulation Profile:  Recent Labs Lab 11/24/15 0323  INR 1.15    Cardiac Enzymes: No results for input(s): CKTOTAL, CKMB, CKMBINDEX, TROPONINI in the last 168 hours. BNP (last 3 results) No results for input(s): PROBNP in the last 8760 hours. HbA1C: No results for input(s): HGBA1C in the last 72 hours. CBG: No results for input(s): GLUCAP in the last 168 hours. Lipid Profile: No results for input(s): CHOL, HDL, LDLCALC, TRIG, CHOLHDL, LDLDIRECT in the last 72 hours. Thyroid Function Tests: No results for input(s): TSH, T4TOTAL, FREET4, T3FREE, THYROIDAB in the last 72 hours. Anemia Panel: No results for input(s): VITAMINB12, FOLATE, FERRITIN, TIBC, IRON, RETICCTPCT in the last 72 hours. Urine analysis: No results found for: COLORURINE, APPEARANCEUR, LABSPEC, PHURINE, GLUCOSEU, HGBUR, BILIRUBINUR, KETONESUR, PROTEINUR, UROBILINOGEN, NITRITE, LEUKOCYTESUR Sepsis Labs: @LABRCNTIP (procalcitonin:4,lacticidven:4)  ) Recent Results (from the past 240 hour(s))  Surgical pcr screen     Status: None   Collection Time: 11/24/15  4:03 AM  Result Value Ref Range Status   MRSA, PCR NEGATIVE NEGATIVE Final   Staphylococcus aureus NEGATIVE NEGATIVE Final    Comment:        The Xpert SA Assay (FDA approved for NASAL specimens in patients over 65 years of age), is one component of a comprehensive surveillance program.  Test performance has been validated by [facility] for patients greater than or equal to 6 year old. It is not intended to diagnose infection nor to guide or monitor treatment.       Radiology Studies: Dg Chest 1 View  Result Date: 11/23/2015 CLINICAL DATA:  Patient fell at his home this pm, landed on buttocks, posterior hip pain EXAM: CHEST 1 VIEW COMPARISON:  None. FINDINGS: Shallow inspiration. Heart size and pulmonary vascularity are normal. No focal airspace disease or consolidation in the lungs. Mild peribronchial thickening suggesting chronic bronchitis. No blunting of costophrenic angles. No pneumothorax. Mediastinal contours  appear intact. Probable old fracture deformity of the left proximal humerus. IMPRESSION: Mild chronic bronchitic changes in the lungs. No evidence of active pulmonary disease. Electronically Signed   By: Lucienne Capers M.D.   On: 11/23/2015 23:43   Ct Head Wo Contrast  Result Date: 11/24/2015 CLINICAL DATA:  Initial evaluation for acute trauma, fall the EXAM: CT HEAD WITHOUT CONTRAST CT CERVICAL SPINE WITHOUT CONTRAST TECHNIQUE: Multidetector CT imaging of the head and cervical spine was performed following the standard protocol without intravenous contrast. Multiplanar CT image reconstructions of the cervical spine were also generated. COMPARISON:  None. FINDINGS: CT HEAD FINDINGS Brain: Age appropriate cerebral atrophy present. Mild chronic microvascular ischemic disease. Extensive encephalomalacia within the right parietotemporal region, compatible with remote right MCA territory infarct. Scattered remote bilateral basal ganglia lacunar infarcts present as well, right greater than left. No acute intracranial hemorrhage. No evidence for acute  large vessel territory infarct. No mass lesion, midline shift, or mass effect. Ex vacuo dilatation of the right lateral ventricle related to the remote right MCA territory infarct. No hydrocephalus. No extra-axial fluid collection. Vascular: Prominent vascular calcifications present within the carotid siphons and distal vertebral arteries. No hyperdense vessel. Skull: Small posterior scalp contusion. Scalp soft tissues otherwise within normal limits. Calvarium intact. Sinuses/Orbits: The globes and orbital soft tissues within normal limits. Minimal mucosal thickening within the left maxillary sinus. Paranasal sinuses are otherwise clear. Chronic opacification of the bilateral mastoid air cells noted, of doubtful acute clinical significance. Other: No other significant finding. CT CERVICAL SPINE FINDINGS Alignment: Vertebral bodies normally aligned with preservation of  the normal cervical lordosis. No listhesis. Skull base and vertebrae: Visualized skullbase intact. Vertebral bodies intact without evidence for fracture. Soft tissues and spinal canal: Paraspinous soft tissues demonstrate no acute abnormality. Prominent vascular calcifications noted about the carotid bifurcations. 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 Upper chest: Visualized lung apices are clear.  No pneumothorax. Other: No other significant finding. IMPRESSION: CT BRAIN: 1. No acute intracranial process. 2. Small posterior scalp contusion.  No skull fracture. 3. Remote right MCA territory infarct, with additional remote lacunar infarcts involving the bilateral basal ganglia. CT CERVICAL SPINE: 1. No acute traumatic injury within the cervical spine. Electronically Signed   By: Jeannine Boga M.D.   On: 11/24/2015 00:00   Ct Cervical Spine Wo Contrast  Result Date: 11/24/2015 CLINICAL DATA:  Initial evaluation for acute trauma, fall the EXAM: CT HEAD WITHOUT CONTRAST CT CERVICAL SPINE WITHOUT CONTRAST TECHNIQUE: Multidetector CT imaging of the head and cervical spine was performed following the standard protocol without intravenous contrast. Multiplanar CT image reconstructions of the cervical spine were also generated. COMPARISON:  None. FINDINGS: CT HEAD FINDINGS Brain: Age appropriate cerebral atrophy present. Mild chronic microvascular ischemic disease. Extensive encephalomalacia within the right parietotemporal region, compatible with remote right MCA territory infarct. Scattered remote bilateral basal ganglia lacunar infarcts present as well, right greater than left. No acute intracranial hemorrhage. No evidence for acute large vessel territory infarct. No mass lesion, midline shift, or mass effect. Ex vacuo dilatation of the right lateral ventricle related to the remote right MCA territory infarct. No hydrocephalus. No extra-axial fluid collection. Vascular: Prominent  vascular calcifications present within the carotid siphons and distal vertebral arteries. No hyperdense vessel. Skull: Small posterior scalp contusion. Scalp soft tissues otherwise within normal limits. Calvarium intact. Sinuses/Orbits: The globes and orbital soft tissues within normal limits. Minimal mucosal thickening within the left maxillary sinus. Paranasal sinuses are otherwise clear. Chronic opacification of the bilateral mastoid air cells noted, of doubtful acute clinical significance. Other: No other significant finding. CT CERVICAL SPINE FINDINGS Alignment: Vertebral bodies normally aligned with preservation of the normal cervical lordosis. No listhesis. Skull base and vertebrae: Visualized skullbase intact. Vertebral bodies intact without evidence for fracture. Soft tissues and spinal canal: Paraspinous soft tissues demonstrate no acute abnormality. Prominent vascular calcifications noted about the carotid bifurcations. 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 Upper chest: Visualized lung apices are clear.  No pneumothorax. Other: No other significant finding. IMPRESSION: CT BRAIN: 1. No acute intracranial process. 2. Small posterior scalp contusion.  No skull fracture. 3. Remote right MCA territory infarct, with additional remote lacunar infarcts involving the bilateral basal ganglia. CT CERVICAL SPINE: 1. No acute traumatic injury within the cervical spine. Electronically Signed   By: Jeannine Boga M.D.   On: 11/24/2015  00:00   Dg Hip Unilat W Or Wo Pelvis 2-3 Views Left  Result Date: 11/23/2015 CLINICAL DATA:  Initial evaluation for acute trauma, fall. EXAM: DG HIP (WITH OR WITHOUT PELVIS) 2-3V LEFT COMPARISON:  None. FINDINGS: There is an acute transverse fracture through the subcapital region of the left femoral neck with slight superior subluxation. Femoral head remains normally aligned within the acetabulum. Remainder the bony pelvis intact. SI joints approximated.  No acute abnormality about the right hip. Degenerative changes noted within the lower lumbar spine. Prominent vascular calcifications noted. IMPRESSION: Acute fracture through the left femoral neck with slight superior subluxation. Electronically Signed   By: Jeannine Boga M.D.   On: 11/23/2015 23:35     Scheduled Meds: . [MAR Hold] aspirin EC  81 mg Oral Daily  . [MAR Hold] atorvastatin  80 mg Oral Daily  . chlorhexidine  60 mL Topical Once  . [MAR Hold] cholecalciferol  4,000 Units Oral Daily  . [MAR Hold] fluticasone  1 spray Each Nare Daily  . [MAR Hold] loratadine  10 mg Oral Daily  . [MAR Hold] metoprolol tartrate  25 mg Oral BID  . [MAR Hold] multivitamin with minerals  1 tablet Oral Daily  . povidone-iodine  2 application Topical Once  . [MAR Hold] senna  1 tablet Oral Daily  . [MAR Hold] thiamine  100 mg Oral Daily  . [MAR Hold] tranexamic acid  (ORTHO-IV)  1,000 mg Intravenous To OR   Continuous Infusions: . sodium chloride 1,000 mL (11/24/15 0130)  . lactated ringers 10 mL/hr at 11/24/15 1141     LOS: 0 days    Time spent: 30 min    Janece Canterbury, MD [facility] Pager [phone]  If 7PM-7AM, please contact night-coverage [email or web address] Password [email or web address] 11/24/2015, 1:15 PM
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
## 2015-11-24 NOTE — Discharge Instructions (Signed)

## 2015-11-24 NOTE — Progress Notes (Signed)
Patient has been NPO since MN.

## 2015-11-24 NOTE — Transfer of Care (Signed)
Immediate Anesthesia Transfer of Care Note  Patient: Douglas Vargas  Procedure(s) Performed: Procedure(s): ANTERIOR APPROACH HEMI HIP ARTHROPLASTY (Left)  Patient Location: PACU  Anesthesia Type:General  Level of Consciousness: awake, alert  and oriented  Airway & Oxygen Therapy: Patient Spontanous Breathing and Patient connected to nasal cannula oxygen  Post-op Assessment: Report given to RN and Post -op Vital signs reviewed and stable  Post vital signs: Reviewed and stable  Last Vitals:  Vitals:   11/24/15 0236 11/24/15 1153  BP: (!) 167/81 (!) 157/73  Pulse: 87   Resp: 18   Temp: 37.5 C     Last Pain:  Vitals:   11/24/15 1014  TempSrc:   PainSc: 2       Patients Stated Pain Goal: 3 ([id number] [phone])  Complications: No apparent anesthesia complications

## 2015-11-24 NOTE — Anesthesia Procedure Notes (Signed)
Procedure Name: Intubation Date/Time: 11/24/2015 12:20 PM Performed by: Neldon Newport Pre-anesthesia Checklist: Patient identified, Emergency Drugs available, Suction available, Patient being monitored and Timeout performed Patient Re-evaluated:Patient Re-evaluated prior to inductionOxygen Delivery Method: Circle system utilized Preoxygenation: Pre-oxygenation with 100% oxygen Intubation Type: IV induction Ventilation: Mask ventilation without difficulty and Oral airway inserted - appropriate to patient size Laryngoscope Size: Mac, 3, Glidescope and 4 Grade View: Grade III Tube type: Oral Tube size: 7.5 mm Number of attempts: 3 (DL x1 by CRNA unseccessful. DLx1 with bougie by Dr.Allen unceccessful. DL with glidescipe by Dr.Allen successful.) Airway Equipment and Method: Stylet Placement Confirmation: ETT inserted through vocal cords under direct vision,  positive ETCO2 and breath sounds checked- equal and bilateral Secured at: 24 cm Tube secured with: Tape Dental Injury: Teeth and Oropharynx as per pre-operative assessment

## 2015-11-25 ENCOUNTER — Encounter (HOSPITAL_COMMUNITY): Payer: Self-pay | Admitting: Orthopedic Surgery

## 2015-11-25 DIAGNOSIS — S72002D Fracture of unspecified part of neck of left femur, subsequent encounter for closed fracture with routine healing: Secondary | ICD-10-CM

## 2015-11-25 LAB — BASIC METABOLIC PANEL WITH GFR
Anion gap: 6 (ref 5–15)
BUN: 9 mg/dL (ref 6–20)
CO2: 24 mmol/L (ref 22–32)
Calcium: 8.2 mg/dL — ABNORMAL LOW (ref 8.9–10.3)
Chloride: 108 mmol/L (ref 101–111)
Creatinine, Ser: 0.9 mg/dL (ref 0.61–1.24)
GFR calc Af Amer: >60 mL/min
GFR calc non Af Amer: >60 mL/min
Glucose, Bld: 152 mg/dL — ABNORMAL HIGH (ref 65–99)
Potassium: 3.9 mmol/L (ref 3.5–5.1)
Sodium: 138 mmol/L (ref 135–145)

## 2015-11-25 LAB — CBC
HEMATOCRIT: 31.6 % — AB (ref 39.0–52.0)
HEMOGLOBIN: 10.7 g/dL — AB (ref 13.0–17.0)
MCH: 31.7 pg (ref 26.0–34.0)
MCHC: 33.9 g/dL (ref 30.0–36.0)
MCV: 93.5 fL (ref 78.0–100.0)
Platelets: 117 10*3/uL — ABNORMAL LOW (ref 150–400)
RBC: 3.38 MIL/uL — AB (ref 4.22–5.81)
RDW: 13.8 % (ref 11.5–15.5)
WBC: 13.2 10*3/uL — ABNORMAL HIGH (ref 4.0–10.5)

## 2015-11-25 MED ORDER — GUAIFENESIN ER 600 MG PO TB12
600.0000 mg | ORAL_TABLET | Freq: Two times a day (BID) | ORAL | Status: DC
  Administered 2015-11-25 – 2015-11-27 (×5): 600 mg via ORAL
  Filled 2015-11-25 (×5): qty 1

## 2015-11-25 NOTE — Progress Notes (Signed)
Subjective: 1 Day Post-Op Procedure(s) (LRB): ANTERIOR APPROACH HEMI HIP ARTHROPLASTY (Left) Patient reports pain as moderate. Taking By mouth. Voiding okay. Was a resident at [facility] prior to admission.  Objective: Vital signs in last 24 hours: Temp:  [97 F (36.1 C)-98.3 F (36.8 C)] 97.9 F (36.6 C) (09/22 0340) Pulse Rate:  [67-90] 75 (09/22 0340) Resp:  [12-17] 16 (09/22 0340) BP: (123-160)/(64-83) 135/64 (09/22 0340) SpO2:  [95 %-99 %] 97 % (09/22 0340)  Intake/Output from previous day: 09/21 0701 - 09/22 0700 In: 3663.3 [P.O.:237; I.V.:2976.3; IV Piggyback:450] Out: [id number] [Urine:1450; Blood:1000] Intake/Output this shift: Total I/O In: 480 [P.O.:480] Out: 290 [Urine:290]   Recent Labs  11/23/15 2342 11/24/15 0323 11/25/15 0536  HGB 15.9 15.9 10.7*    Recent Labs  11/24/15 0323 11/25/15 0536  WBC 19.6* 13.2*  RBC 4.99 3.38*  HCT 45.5 31.6*  PLT 143* 117*    Recent Labs  11/24/15 0323 11/25/15 0536  NA 137 138  K 4.2 3.9  CL 105 108  CO2 23 24  BUN 22* 9  CREATININE 1.02 0.90  GLUCOSE 134* 152*  CALCIUM 9.1 8.2*    Recent Labs  11/24/15 0323  INR 1.15  Left hip exam:  Neurovascular intact Sensation intact distally Intact pulses distally Dorsiflexion/Plantar flexion intact Incision: dressing C/D/I Compartment soft  Assessment/Plan: 1 Day Post-Op Procedure(s) (LRB): ANTERIOR APPROACH HEMI HIP ARTHROPLASTY (Left) Plan: Weight-bear as tolerated on left without hip precautions. Aspirin 325 mg twice daily enteric-coated 1 month postop for DVT prophylaxis. Norco 5 mg as needed for pain. (Rx written) Up with therapy Discharge to SNF hopefully Saturday. I have spoken to the case manager/social worker about this. Will need follow-up with Dr. Berenice Primas in the office in 2 weeks.  Berlene Dixson G 11/25/2015, 12:36 PM

## 2015-11-25 NOTE — Evaluation (Signed)
Physical Therapy Evaluation Patient Details Name: Douglas Vargas MRN: [id number] DOB: 01-19-1951 Today's Date: 11/25/2015   History of Present Illness  This is a 65 y.o male s/p L anterior approach hemi hip arthroplasty s/p fall. He has a PMH significant for hypertension, hyperlipidemia, subdural hematoma, TIA, GERD, Hx-TIA (Acute R MCA), and substance abuse.  Clinical Impression  Pt presented supine in bed with HOB elevated, awake and with lots of encouragement was agreeable to participating in therapy session. Prior to admission, pt reported that he ambulated using a RW. Pt would continue to benefit from skilled physical therapy services at this time while admitted and after d/c to address his below listed limitations in order to improve his overall safety and independence with functional mobility.     Follow Up Recommendations SNF    Equipment Recommendations  None recommended by PT    Recommendations for Other Services       Precautions / Restrictions Precautions Precautions: Fall Restrictions Weight Bearing Restrictions: Yes LLE Weight Bearing: Weight bearing as tolerated      Mobility  Bed Mobility Overal bed mobility: Needs Assistance;+2 for physical assistance Bed Mobility: Supine to Sit Rolling: Mod assist;+2 for physical assistance         General bed mobility comments: Pt required mod A at bilateral LEs and upper body with use of bed pad to assist with rotating hips to achieve sitting EOB.  Transfers Overall transfer level: Needs assistance Equipment used: Rolling walker (2 wheeled) Transfers: Sit to/from [facility] Sit to Stand: Mod assist;From elevated surface Stand pivot transfers: Min assist       General transfer comment: pt required increased time, VC'ing for bilateral hand positioning and mod A to achieve full standing position  Ambulation/Gait                Stairs            Wheelchair Mobility    Modified Rankin  (Stroke Patients Only)       Balance Overall balance assessment: Needs assistance Sitting-balance support: Feet supported;Single extremity supported Sitting balance-Leahy Scale: Poor     Standing balance support: During functional activity;Bilateral upper extremity supported Standing balance-Leahy Scale: Poor                               Pertinent Vitals/Pain Pain Assessment: 0-10 Pain Score: 7  Pain Location: L hip Pain Descriptors / Indicators: Sore;Grimacing;Guarding Pain Intervention(s): Monitored during session;Repositioned    Home Living Family/patient expects to be discharged to:: Skilled nursing facility                 Additional Comments: Pt currently lives in SNF    Prior Function Level of Independence: Needs assistance   Gait / Transfers Assistance Needed: pt stated that prior to admission he ambulated using a SPC  ADL's / Homemaking Assistance Needed: total A per HP in medical record        Hand Dominance   Dominant Hand: Right    Extremity/Trunk Assessment   Upper Extremity Assessment: Defer to OT evaluation       LUE Deficits / Details: Decreased L shoulder AROM and L hand dexterity secondary to previous injury.   Lower Extremity Assessment: LLE deficits/detail   LLE Deficits / Details: Pt with decreased strength and ROM limitations secondary to post-op. Sensation is grossly intact.     Communication   Communication: No difficulties  Cognition Arousal/Alertness: Awake/alert  Behavior During Therapy: Agitated;Anxious Overall Cognitive Status: No family/caregiver present to determine baseline cognitive functioning                      General Comments General comments (skin integrity, edema, etc.): Pt agitated and refused much movement secondary to pain.    Exercises     Assessment/Plan    PT Assessment Patient needs continued PT services  PT Problem List Decreased strength;Decreased range of  motion;Decreased activity tolerance;Decreased balance;Decreased mobility;Decreased coordination;Decreased knowledge of use of DME;Pain          PT Treatment Interventions DME instruction;Gait training;Stair training;Functional mobility training;Therapeutic activities;Balance training;Therapeutic exercise;Neuromuscular re-education;Patient/family education    PT Goals (Current goals can be found in the Care Plan section)  Acute Rehab PT Goals Patient Stated Goal: to go back to SNF PT Goal Formulation: With patient Time For Goal Achievement: 12/02/15 Potential to Achieve Goals: Fair    Frequency Min 3X/week   Barriers to discharge        Co-evaluation               End of Session Equipment Utilized During Treatment: Gait belt Activity Tolerance: Patient limited by pain;Patient limited by fatigue Patient left: in chair;with call bell/phone within reach;with chair alarm set Nurse Communication: Mobility status         Time: [id number] PT Time Calculation (min) (ACUTE ONLY): 27 min   Charges:   PT Evaluation $PT Eval Moderate Complexity: 1 Procedure PT Treatments $Therapeutic Activity: 8-22 mins   PT G CodesClearnce Sorrel Kyann Heydt 11/25/2015, 2:44 PM Sherie Don, [place], DPT [phone]

## 2015-11-25 NOTE — Progress Notes (Signed)
Initial Nutrition Assessment  DOCUMENTATION CODES:   Not applicable  INTERVENTION:  Family to bring in nourishment snacks.   Encourage adequate PO intake.   NUTRITION DIAGNOSIS:   Increased nutrient needs related to  (s/p surgery) as evidenced by estimated needs.  GOAL:   Patient will meet greater than or equal to 90% of their needs  MONITOR:   PO intake, Labs, Weight trends, Skin, I & O's  REASON FOR ASSESSMENT:   Consult Assessment of nutrition requirement/status  ASSESSMENT:   65 y.o. male with medical history significant of hypertension, hyperlipidemia, SDH, TIA, GERD, who presented with left hip pain after mechanical fall.  X-ray of left hip confirmed left femoral neck fracture. Transferred to [facility] on 11/24/2015 for surgery at the request of the orthopedic surgeon. Now s/p  Left hip hemi arthroplasty.   Pt reports having a good appetite currently and PTA with no other difficulties. Meal completion has been 85-100%. Pt reports having had weight loss in the past, however has been gaining the weight back. Pt does report some difficulties eating his food at meals as he is missing some teeth. RD offered to downgrade diet to dysphagia 3, however pt declined, reporting he or the nurse tech can cut up the foods on the trays. RD additionally offered nutritional supplements and/or nourishment snacks, but pt declined reporting his family has been bringing him snacks.   Pt with no observed significant fat or muscle mass loss.   Labs and medications reviewed.   Diet Order:  Diet regular Room service appropriate? Yes; Fluid consistency: Thin  Skin:   (Incision on L hip)  Last BM:  9/20  Height:   Ht Readings from Last 1 Encounters:  11/24/15 6' (1.829 m)    Weight:   Wt Readings from Last 1 Encounters:  11/24/15 202 lb 1.6 oz (91.7 kg)    Ideal Body Weight:  80.9 kg  BMI:  Body mass index is 27.41 kg/m.  Estimated Nutritional Needs:   Kcal:   2100-2400  Protein:  100-115 grams  Fluid:  2.1 - 2.4 L/day  EDUCATION NEEDS:   No education needs identified at this time  Corrin Parker, MS, RD, LDN Pager # [phone] After hours/ weekend pager # [phone]

## 2015-11-25 NOTE — Progress Notes (Signed)
PROGRESS NOTE [facility]   Douglas Vargas   [id number] DOB: 05-10-1950  DOA: 11/23/2015 PCP: No primary care provider on file.   Brief Narrative:  Douglas Vargas a 65 y.o.malewith medical history significant of hypertension, hyperlipidemia, SDH, TIA, GERD, who presented with left hip pain after mechanical fall.  X-ray of left hip confirmed left femoral neck fracture. Transferred to [facility] on 11/24/2015 for surgery at the request of the orthopedic surgeon. Now s/p  Left hip hemi arthroplasty day #1.   Subjective:  Patient seen and examined this morning. Patient reported pain improved significantly since surgery. Patient had no complaints other than some soreness in the left hip. Incentive spirometry at bedside. Patient denies chest pain, shortness of breath, nausea, vomiting, and dizziness. No acute events overnight.  Assessment & Plan:   Closed fracture of left femoral neck with displacement:s/p Left hip hemi arthroplasty day #1 - Management per ortho  - Likely to go to SNF - Pain control as needed - Continue incentive spirometry - discussed and teach how to use.  Leukocytosis: Likely due to stress-induced demargination. Patient does not have signs of infection. Now trending down.   HTN: Stable  -Continue metoprolol -IV hydralazine when necessary  [id number] -Continue home medications: Lipitor  GERD: Stable  -Protonix  TIA (transient ischemic attack):No new issues. -Continue aspirin and Lipitor   DVT ppx: SCD Code Status:Full code Family Communication: None at bed side Disposition Plan: When clear by ortho will go to SNF  Consultants:   Ortho  Procedures:   Hip Hemi Arthoplasty  Antimicrobials:  None   Objective: Vitals:   11/24/15 1552 11/24/15 2045 11/24/15 2359 11/25/15 0340  BP: 139/72 127/74 126/66 135/64  Pulse: 74 70 85 75  Resp: 15 16  16   Temp: 97.7 F (36.5 C) 98.3 F (36.8 C)  97.9 F (36.6 C)    TempSrc:  Oral  Oral  SpO2: 96% 96%  97%  Weight:      Height:        Intake/Output Summary (Last 24 hours) at 11/25/15 1010 Last data filed at 11/25/15 0900  Gross per 24 hour  Intake          4143.25 ml  Output             2450 ml  Net          1693.25 ml   Filed Weights   11/23/15 2307 11/24/15 0236  Weight: 92.1 kg (203 lb) 91.7 kg (202 lb 1.6 oz)    Examination:  General exam: Appears calm and comfortable  HEENT: AC/AT, PERRLA, OP moist and clear  Respiratory system: Clear to auscultation. No wheezes,crackle or rhonchi Cardiovascular system: S1 & S2 heard, RRR. No JVD, murmurs, rubs or gallops Gastrointestinal system: Abdomen is nondistended, soft and nontender. No organomegaly or masses felt. Central nervous system: Alert and oriented. No focal neurological deficits. Extremities: No pedal edema. Dressing on Left hip dry and intact Skin: No rashes, lesions or ulcers Psychiatry: Judgement and insight appear normal. Mood & affect appropriate.    Data Reviewed: I have personally reviewed following labs and imaging studies  CBC:  Recent Labs Lab 11/23/15 2342 11/24/15 0323 11/25/15 0536  WBC 16.4* 19.6* 13.2*  NEUTROABS 13.8*  --   --   HGB 15.9 15.9 10.7*  HCT 43.7 45.5 31.6*  MCV 89.0 91.2 93.5  PLT 174 143* [id number]*   Basic Metabolic Panel:  Recent Labs Lab 11/24/15 0323 11/25/15 0536  NA  137 138  K 4.2 3.9  CL 105 108  CO2 23 24  GLUCOSE 134* 152*  BUN 22* 9  CREATININE 1.02 0.90  CALCIUM 9.1 8.2*   GFR: Estimated Creatinine Clearance: 87.4 mL/min (by C-G formula based on SCr of 0.9 mg/dL). Liver Function Tests: No results for input(s): AST, ALT, ALKPHOS, BILITOT, PROT, ALBUMIN in the last 168 hours. No results for input(s): LIPASE, AMYLASE in the last 168 hours. No results for input(s): AMMONIA in the last 168 hours. Coagulation Profile:  Recent Labs Lab 11/24/15 0323  INR 1.15   Cardiac Enzymes: No results for input(s): CKTOTAL, CKMB,  CKMBINDEX, TROPONINI in the last 168 hours. BNP (last 3 results) No results for input(s): PROBNP in the last 8760 hours. HbA1C: No results for input(s): HGBA1C in the last 72 hours. CBG: No results for input(s): GLUCAP in the last 168 hours. Lipid Profile: No results for input(s): CHOL, HDL, LDLCALC, TRIG, CHOLHDL, LDLDIRECT in the last 72 hours. Thyroid Function Tests: No results for input(s): TSH, T4TOTAL, FREET4, T3FREE, THYROIDAB in the last 72 hours. Anemia Panel: No results for input(s): VITAMINB12, FOLATE, FERRITIN, TIBC, IRON, RETICCTPCT in the last 72 hours. Sepsis Labs: No results for input(s): PROCALCITON, LATICACIDVEN in the last 168 hours.  Recent Results (from the past 240 hour(s))  Surgical pcr screen     Status: None   Collection Time: 11/24/15  4:03 AM  Result Value Ref Range Status   MRSA, PCR NEGATIVE NEGATIVE Final   Staphylococcus aureus NEGATIVE NEGATIVE Final    Comment:        The Xpert SA Assay (FDA approved for NASAL specimens in patients over 75 years of age), is one component of a comprehensive surveillance program.  Test performance has been validated by [facility] for patients greater than or equal to 90 year old. It is not intended to diagnose infection nor to guide or monitor treatment.      Radiology Studies: Dg Chest 1 View  Result Date: 11/23/2015 CLINICAL DATA:  Patient fell at his home this pm, landed on buttocks, posterior hip pain EXAM: CHEST 1 VIEW COMPARISON:  None. FINDINGS: Shallow inspiration. Heart size and pulmonary vascularity are normal. No focal airspace disease or consolidation in the lungs. Mild peribronchial thickening suggesting chronic bronchitis. No blunting of costophrenic angles. No pneumothorax. Mediastinal contours appear intact. Probable old fracture deformity of the left proximal humerus. IMPRESSION: Mild chronic bronchitic changes in the lungs. No evidence of active pulmonary disease. Electronically Signed   By:  Lucienne Capers M.D.   On: 11/23/2015 23:43   Ct Head Wo Contrast  Result Date: 11/24/2015 CLINICAL DATA:  Initial evaluation for acute trauma, fall the EXAM: CT HEAD WITHOUT CONTRAST CT CERVICAL SPINE WITHOUT CONTRAST TECHNIQUE: Multidetector CT imaging of the head and cervical spine was performed following the standard protocol without intravenous contrast. Multiplanar CT image reconstructions of the cervical spine were also generated. COMPARISON:  None. FINDINGS: CT HEAD FINDINGS Brain: Age appropriate cerebral atrophy present. Mild chronic microvascular ischemic disease. Extensive encephalomalacia within the right parietotemporal region, compatible with remote right MCA territory infarct. Scattered remote bilateral basal ganglia lacunar infarcts present as well, right greater than left. No acute intracranial hemorrhage. No evidence for acute large vessel territory infarct. No mass lesion, midline shift, or mass effect. Ex vacuo dilatation of the right lateral ventricle related to the remote right MCA territory infarct. No hydrocephalus. No extra-axial fluid collection. Vascular: Prominent vascular calcifications present within the carotid siphons and distal vertebral  arteries. No hyperdense vessel. Skull: Small posterior scalp contusion. Scalp soft tissues otherwise within normal limits. Calvarium intact. Sinuses/Orbits: The globes and orbital soft tissues within normal limits. Minimal mucosal thickening within the left maxillary sinus. Paranasal sinuses are otherwise clear. Chronic opacification of the bilateral mastoid air cells noted, of doubtful acute clinical significance. Other: No other significant finding. CT CERVICAL SPINE FINDINGS Alignment: Vertebral bodies normally aligned with preservation of the normal cervical lordosis. No listhesis. Skull base and vertebrae: Visualized skullbase intact. Vertebral bodies intact without evidence for fracture. Soft tissues and spinal canal: Paraspinous soft  tissues demonstrate no acute abnormality. Prominent vascular calcifications noted about the carotid bifurcations. 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 Upper chest: Visualized lung apices are clear.  No pneumothorax. Other: No other significant finding. IMPRESSION: CT BRAIN: 1. No acute intracranial process. 2. Small posterior scalp contusion.  No skull fracture. 3. Remote right MCA territory infarct, with additional remote lacunar infarcts involving the bilateral basal ganglia. CT CERVICAL SPINE: 1. No acute traumatic injury within the cervical spine. Electronically Signed   By: Jeannine Boga M.D.   On: 11/24/2015 00:00   Ct Cervical Spine Wo Contrast  Result Date: 11/24/2015 CLINICAL DATA:  Initial evaluation for acute trauma, fall the EXAM: CT HEAD WITHOUT CONTRAST CT CERVICAL SPINE WITHOUT CONTRAST TECHNIQUE: Multidetector CT imaging of the head and cervical spine was performed following the standard protocol without intravenous contrast. Multiplanar CT image reconstructions of the cervical spine were also generated. COMPARISON:  None. FINDINGS: CT HEAD FINDINGS Brain: Age appropriate cerebral atrophy present. Mild chronic microvascular ischemic disease. Extensive encephalomalacia within the right parietotemporal region, compatible with remote right MCA territory infarct. Scattered remote bilateral basal ganglia lacunar infarcts present as well, right greater than left. No acute intracranial hemorrhage. No evidence for acute large vessel territory infarct. No mass lesion, midline shift, or mass effect. Ex vacuo dilatation of the right lateral ventricle related to the remote right MCA territory infarct. No hydrocephalus. No extra-axial fluid collection. Vascular: Prominent vascular calcifications present within the carotid siphons and distal vertebral arteries. No hyperdense vessel. Skull: Small posterior scalp contusion. Scalp soft tissues otherwise within normal limits.  Calvarium intact. Sinuses/Orbits: The globes and orbital soft tissues within normal limits. Minimal mucosal thickening within the left maxillary sinus. Paranasal sinuses are otherwise clear. Chronic opacification of the bilateral mastoid air cells noted, of doubtful acute clinical significance. Other: No other significant finding. CT CERVICAL SPINE FINDINGS Alignment: Vertebral bodies normally aligned with preservation of the normal cervical lordosis. No listhesis. Skull base and vertebrae: Visualized skullbase intact. Vertebral bodies intact without evidence for fracture. Soft tissues and spinal canal: Paraspinous soft tissues demonstrate no acute abnormality. Prominent vascular calcifications noted about the carotid bifurcations. 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 Upper chest: Visualized lung apices are clear.  No pneumothorax. Other: No other significant finding. IMPRESSION: CT BRAIN: 1. No acute intracranial process. 2. Small posterior scalp contusion.  No skull fracture. 3. Remote right MCA territory infarct, with additional remote lacunar infarcts involving the bilateral basal ganglia. CT CERVICAL SPINE: 1. No acute traumatic injury within the cervical spine. Electronically Signed   By: Jeannine Boga M.D.   On: 11/24/2015 00:00   Dg C-arm 1-60 Min  Result Date: 11/24/2015 CLINICAL DATA:  Left hemiarthroplasty of hip with anterior approach. Main OR 3. Fluoro time 0 mins 6 sec. EXAM: OPERATIVE LEFT HIP (WITH PELVIS IF PERFORMED) 2 VIEWS TECHNIQUE: Fluoroscopic spot image(s) were submitted  for interpretation post-operatively. COMPARISON:  11/23/2015 FINDINGS: AP views are performed of the left hip demonstrating interval hemiarthroplasty. The hardware appears intact. No evidence for dislocation on the frontal views provided. No interval fractures. IMPRESSION: Left hip arthroplasty.  No adverse features. Electronically Signed   By: Nolon Nations M.D.   On: 11/24/2015 13:56     Dg Hip Operative Unilat W Or W/o Pelvis Left  Result Date: 11/24/2015 CLINICAL DATA:  Left hemiarthroplasty of hip with anterior approach. Main OR 3. Fluoro time 0 mins 6 sec. EXAM: OPERATIVE LEFT HIP (WITH PELVIS IF PERFORMED) 2 VIEWS TECHNIQUE: Fluoroscopic spot image(s) were submitted for interpretation post-operatively. COMPARISON:  11/23/2015 FINDINGS: AP views are performed of the left hip demonstrating interval hemiarthroplasty. The hardware appears intact. No evidence for dislocation on the frontal views provided. No interval fractures. IMPRESSION: Left hip arthroplasty.  No adverse features. Electronically Signed   By: Nolon Nations M.D.   On: 11/24/2015 13:56   Dg Hip Unilat W Or Wo Pelvis 2-3 Views Left  Result Date: 11/23/2015 CLINICAL DATA:  Initial evaluation for acute trauma, fall. EXAM: DG HIP (WITH OR WITHOUT PELVIS) 2-3V LEFT COMPARISON:  None. FINDINGS: There is an acute transverse fracture through the subcapital region of the left femoral neck with slight superior subluxation. Femoral head remains normally aligned within the acetabulum. Remainder the bony pelvis intact. SI joints approximated. No acute abnormality about the right hip. Degenerative changes noted within the lower lumbar spine. Prominent vascular calcifications noted. IMPRESSION: Acute fracture through the left femoral neck with slight superior subluxation. Electronically Signed   By: Jeannine Boga M.D.   On: 11/23/2015 23:35    Scheduled Meds: . aspirin EC  325 mg Oral BID WC  . atorvastatin  80 mg Oral Daily  . cholecalciferol  4,000 Units Oral Daily  . ferrous sulfate  325 mg Oral BID WC  . fluticasone  1 spray Each Nare Daily  . loratadine  10 mg Oral Daily  . metoprolol tartrate  25 mg Oral BID  . multivitamin with minerals  1 tablet Oral Daily  . senna  1 tablet Oral Daily  . thiamine  100 mg Oral Daily   Continuous Infusions: . sodium chloride 75 mL/hr at 11/24/15 1807     LOS: 1 day    Chipper Oman, MD [facility] Pager [phone]  If 7PM-7AM, please contact night-coverage [email or web address] Password [email or web address] 11/25/2015, 10:10 AM

## 2015-11-25 NOTE — Evaluation (Signed)
Occupational Therapy Evaluation Patient Details Name: Douglas Vargas MRN: [id number] DOB: 07-31-1950 Today's Date: 11/25/2015    History of Present Illness This is a 65 y.o male s/p L anterior approach hemi hip arthroplasty s/p fall. He has a PMH significant for hypertension, hyperlipidemia, subdural hematoma, TIA, GERD, Hx-TIA (Acute R MCA), and substance abuse.   Clinical Impression   PTA this 65 y.o. Male lives in a SNF and required max assist with ADLs secondary to decreased motivation to complete. Pt admitted and underwent above. Currently, pt requires maximum to total assistance with all ADL tasks and refused to sit EOB upon multiple attempts from OT. Was able to assist with roll and scoot up in bed with mod A +2 assist. OT feels that pt has potential to complete ADLs with assistance. Will continue to follow acutely. Pt plans to D/C back to SNF. Recommend follow up OT at SNF.    Follow Up Recommendations  SNF    Equipment Recommendations   (Defer to subsequent venue.)       Precautions / Restrictions Precautions Precautions: Fall Restrictions Weight Bearing Restrictions: Yes LLE Weight Bearing: Weight bearing as tolerated      Mobility Bed Mobility Overal bed mobility: Needs Assistance;+2 for physical assistance Bed Mobility: Rolling Rolling: Mod assist;+2 for physical assistance         General bed mobility comments: Pt repositioned in bed following pericare.          ADL Overall ADL's : Needs assistance/impaired Eating/Feeding: Supervision/ safety;Set up   Grooming: Supervision/safety;Set up;Sitting   Upper Body Bathing: Minimal assitance;Bed level   Lower Body Bathing: Bed level;Total assistance   Upper Body Dressing : Maximal assistance;Bed level   Lower Body Dressing: Bed level;Total assistance   Toilet Transfer:  (total A for placement of bed pan)   Toileting- Clothing Manipulation and Hygiene: Total assistance;Bed level   Tub/ Shower Transfer:  Maximal assistance;Stand-pivot     General ADL Comments: Pt agitated upon evaluation and refused to sit from supine position upon multiple attempts. he has the potential to complete some ADL's but reports that he prefers to have people do them for him.               Pertinent Vitals/Pain Pain Assessment: 0-10 Pain Score: 5  Pain Location: L hip Pain Descriptors / Indicators: Grimacing;Guarding;Operative site guarding Pain Intervention(s): Repositioned;Monitored during session;Patient requesting pain meds-RN notified;RN gave pain meds during session     Hand Dominance Right   Extremity/Trunk Assessment Upper Extremity Assessment Upper Extremity Assessment: LUE deficits/detail LUE Deficits / Details: Decreased L shoulder AROM and L hand dexterity secondary to previous injury.   Lower Extremity Assessment Lower Extremity Assessment:  (Unable to assess as pt refused to move from supine position.)       Communication Communication Communication: No difficulties   Cognition Arousal/Alertness: Awake/alert Behavior During Therapy: Agitated;Anxious Overall Cognitive Status: History of cognitive impairments - at baseline                                Home Living Family/patient expects to be discharged to:: Skilled nursing facility                                 Additional Comments: Pt currently lives in SNF      Prior Functioning/Environment Level of Independence: Needs assistance    ADL's /  Homemaking Assistance Needed: Maximum assistance per H&P            OT Problem List: Decreased strength;Decreased range of motion;Decreased activity tolerance;Impaired balance (sitting and/or standing);Decreased cognition;Decreased safety awareness;Decreased knowledge of use of DME or AE;Pain   OT Treatment/Interventions: Self-care/ADL training;Therapeutic exercise;Patient/family education;Therapeutic activities;DME and/or AE instruction    OT  Goals(Current goals can be found in the care plan section) Acute Rehab OT Goals Patient Stated Goal: to go back to SNF OT Goal Formulation: With patient Potential to Achieve Goals: Fair ADL Goals Pt Will Transfer to Toilet: with mod assist   End of Session Equipment Utilized During Treatment:  (none) Nurse Communication: Patient requests pain meds (HR monitor beeping)  Activity Tolerance: Patient limited by pain Patient left: in bed;with call bell/phone within reach;with bed alarm set   Time: 1128-1209 OT Time Calculation (min): 41 min Charges:  OT General Charges $OT Visit: 1 Procedure OT Evaluation $OT Eval Moderate Complexity: 1 Procedure OT Treatments $Self Care/Home Management : 23-37 mins G-Codes:    Almon Register 11/25/2015, 12:48 PM

## 2015-11-26 LAB — CBC
HEMATOCRIT: 30.1 % — AB (ref 39.0–52.0)
Hemoglobin: 9.9 g/dL — ABNORMAL LOW (ref 13.0–17.0)
MCH: 31.4 pg (ref 26.0–34.0)
MCHC: 32.9 g/dL (ref 30.0–36.0)
MCV: 95.6 fL (ref 78.0–100.0)
PLATELETS: 100 10*3/uL — AB (ref 150–400)
RBC: 3.15 MIL/uL — ABNORMAL LOW (ref 4.22–5.81)
RDW: 14.3 % (ref 11.5–15.5)
WBC: 9.3 10*3/uL (ref 4.0–10.5)

## 2015-11-26 NOTE — Progress Notes (Signed)
   PATIENT ID: Douglas Vargas   2 Days Post-Op Procedure(s) (LRB): ANTERIOR APPROACH HEMI HIP ARTHROPLASTY (Left)  Subjective: Reports doing well, sore left hip. Also feeling congested in his nose.  Objective:  Vitals:   11/25/15 2124 11/26/15 0408  BP: (!) 116/50 (!) 117/58  Pulse: 80 79  Resp: 16 16  Temp: 97.8 F (36.6 C) 98.1 F (36.7 C)     L hip dressing c/d/i Wiggles toes, distally NVI Calf soft, nontender  Labs:   Recent Labs  11/23/15 2342 11/24/15 0323 11/25/15 0536 11/26/15 0455  HGB 15.9 15.9 10.7* 9.9*   Recent Labs  11/25/15 0536 11/26/15 0455  WBC 13.2* 9.3  RBC 3.38* 3.15*  HCT 31.6* 30.1*  PLT 117* 100*   Recent Labs  11/24/15 0323 11/25/15 0536  NA 137 138  K 4.2 3.9  CL 105 108  CO2 23 24  BUN 22* 9  CREATININE 1.02 0.90  GLUCOSE 134* 152*  CALCIUM 9.1 8.2*    Assessment and Plan: 2 Days Post-Op Procedure(s) (LRB): ANTERIOR APPROACH HEMI HIP ARTHROPLASTY (Left) Plan: Weight-bear as tolerated on left without hip precautions. Aspirin 325 mg twice daily enteric-coated 1 month postop for DVT prophylaxis. Norco 5 mg as needed for pain. (Rx written) Up with therapy Discharge to SNF hopefully today, d/c per primary team Will need follow-up with Dr. Berenice Primas in the office in 2 weeks.

## 2015-11-26 NOTE — Progress Notes (Addendum)
PROGRESS NOTE [facility]   Douglas Vargas   [id number] DOB: March 06, 1950  DOA: 11/23/2015 PCP: No primary care provider on file.   Brief Narrative:  Douglas Vargas a 65 y.o.malewith medical history significant of hypertension, hyperlipidemia, SDH, TIA, GERD, who presented with left hip pain after mechanical fall.  X-ray of left hip confirmed left femoral neck fracture. Transferred to [facility] on 11/24/2015 for surgery at the request of the orthopedic surgeon. Now s/p  Left hip hemi arthroplasty day #2. Cleared by ortho for rehab.    Subjective:  Patient seen and examined today. He should feeling much better today. Have been walking to the bathroom with help. No acute events overnight.   Assessment & Plan:   Closed fracture of left femoral neck with displacement:s/p Left hip hemi arthroplasty day #2  - Management per ortho - clear by ortho for discharge - Will send tomorrow to SNF - Pain control as needed - Continue incentive spirometry - discussed and teach how to use.  Leukocytosis: Resolved Likely due to stress-induced demargination.   HTN: Stable  - Continue home medications  [id number] -Continue home medications: Lipitor  GERD: Stable  -Protonix  TIA (transient ischemic attack):No new issues. -Continue aspirin and Lipitor  Addendum  Anemia - likely 2/2 to blood loss during surgery - Will continue to monitor  - Repeat CBC in AM  DVT ppx: SCD Code Status:Full code Family Communication: None at bed side Disposition Plan: Clear by ortho will send to SNF tomorrow in the morning  Consultants:   Ortho  Procedures:   Hip Hemi Arthoplasty  Antimicrobials:  None   Objective: Vitals:   11/25/15 1300 11/25/15 2124 11/26/15 0408 11/26/15 1411  BP: 113/61 (!) 116/50 (!) 117/58 (!) 112/52  Pulse: 80 80 79 84  Resp: 16 16 16 17   Temp: 97.6 F (36.4 C) 97.8 F (36.6 C) 98.1 F (36.7 C) 98.8 F (37.1 C)  TempSrc: Oral Oral  Oral   SpO2: 95% 97% 93% 94%  Weight:      Height:        Intake/Output Summary (Last 24 hours) at 11/26/15 1536 Last data filed at 11/26/15 1509  Gross per 24 hour  Intake          1351.25 ml  Output             1675 ml  Net          -323.75 ml   Filed Weights   11/23/15 2307 11/24/15 0236  Weight: 92.1 kg (203 lb) 91.7 kg (202 lb 1.6 oz)    Examination:  General exam: Appears calm and comfortable  Respiratory system: Clear to auscultation. No wheezes,crackle or rhonchi Cardiovascular system: S1 & S2 heard, RRR. No JVD, murmurs, rubs or gallops Gastrointestinal system: Abdomen is nondistended, soft and nontender. No organomegaly or masses felt. Central nervous system: Alert and oriented. No focal neurological deficits. Extremities: No pedal edema. Dressing on Left hip dry and intact Skin: No rashes, lesions or ulcers Psychiatry: Judgement and insight appear normal. Mood & affect appropriate.    Data Reviewed: I have personally reviewed following labs and imaging studies  CBC:  Recent Labs Lab 11/23/15 2342 11/24/15 0323 11/25/15 0536 11/26/15 0455  WBC 16.4* 19.6* 13.2* 9.3  NEUTROABS 13.8*  --   --   --   HGB 15.9 15.9 10.7* 9.9*  HCT 43.7 45.5 31.6* 30.1*  MCV 89.0 91.2 93.5 95.6  PLT 174 143* 117* 100*  Basic Metabolic Panel:  Recent Labs Lab 11/24/15 0323 11/25/15 0536  NA 137 138  K 4.2 3.9  CL 105 108  CO2 23 24  GLUCOSE 134* 152*  BUN 22* 9  CREATININE 1.02 0.90  CALCIUM 9.1 8.2*   GFR: Estimated Creatinine Clearance: 87.4 mL/min (by C-G formula based on SCr of 0.9 mg/dL). Liver Function Tests: No results for input(s): AST, ALT, ALKPHOS, BILITOT, PROT, ALBUMIN in the last 168 hours. No results for input(s): LIPASE, AMYLASE in the last 168 hours. No results for input(s): AMMONIA in the last 168 hours. Coagulation Profile:  Recent Labs Lab 11/24/15 0323  INR 1.15   Cardiac Enzymes: No results for input(s): CKTOTAL, CKMB, CKMBINDEX,  TROPONINI in the last 168 hours. BNP (last 3 results) No results for input(s): PROBNP in the last 8760 hours. HbA1C: No results for input(s): HGBA1C in the last 72 hours. CBG: No results for input(s): GLUCAP in the last 168 hours. Lipid Profile: No results for input(s): CHOL, HDL, LDLCALC, TRIG, CHOLHDL, LDLDIRECT in the last 72 hours. Thyroid Function Tests: No results for input(s): TSH, T4TOTAL, FREET4, T3FREE, THYROIDAB in the last 72 hours. Anemia Panel: No results for input(s): VITAMINB12, FOLATE, FERRITIN, TIBC, IRON, RETICCTPCT in the last 72 hours. Sepsis Labs: No results for input(s): PROCALCITON, LATICACIDVEN in the last 168 hours.  Recent Results (from the past 240 hour(s))  Surgical pcr screen     Status: None   Collection Time: 11/24/15  4:03 AM  Result Value Ref Range Status   MRSA, PCR NEGATIVE NEGATIVE Final   Staphylococcus aureus NEGATIVE NEGATIVE Final    Comment:        The Xpert SA Assay (FDA approved for NASAL specimens in patients over 61 years of age), is one component of a comprehensive surveillance program.  Test performance has been validated by [facility] for patients greater than or equal to 49 year old. It is not intended to diagnose infection nor to guide or monitor treatment.      Radiology Studies: No results found.  Scheduled Meds: . aspirin EC  325 mg Oral BID WC  . atorvastatin  80 mg Oral Daily  . cholecalciferol  4,000 Units Oral Daily  . ferrous sulfate  325 mg Oral BID WC  . fluticasone  1 spray Each Nare Daily  . guaiFENesin  600 mg Oral BID  . loratadine  10 mg Oral Daily  . metoprolol tartrate  25 mg Oral BID  . multivitamin with minerals  1 tablet Oral Daily  . senna  1 tablet Oral Daily  . thiamine  100 mg Oral Daily   Continuous Infusions:     LOS: 2 days   Chipper Oman, MD [facility] Pager [phone]  If 7PM-7AM, please contact night-coverage [email or web address] Password [facility] 11/26/2015, 3:36 PM

## 2015-11-26 NOTE — Clinical Social Work Note (Signed)
Clinical Social Work Assessment  Patient Details  Name: Douglas Vargas MRN: [id number] Date of Birth: 07/31/1950  Date of referral:  11/26/15               Reason for consult:  Discharge Planning                Permission sought to share information with:  Case Manager, Facility Sport and exercise psychologist, Family Supports Permission granted to share information::  No  Name::        Agency::     Relationship::     Contact Information:     Housing/Transportation Living arrangements for the past 2 months:  [place] of Information:  Patient, Medical Team Patient Interpreter Needed:  None Criminal Activity/Legal Involvement Pertinent to Current Situation/Hospitalization:  No - Comment as needed Significant Relationships:  Siblings, Friend Lives with:  Facility Resident Do you feel safe going back to the place where you live?  Yes Need for family participation in patient care:  Yes (Comment)  Care giving concerns:Pt expressed no care giving concerns at this time. Pt agreeable to SNF.   Social Worker assessment / plan: Holiday representative met with pt and discussed CSW role with discharge planning. CSW also explained PT recommendation for SNF. Pt informed CSW that he is from [facility] and that he plans to return to [facility] when medically cleared for discharge.   CSW will follow up with pt to assist with discharge.   Employment status:  Disabled (Comment on whether or not currently receiving Disability) Insurance information:   ([place]) PT Recommendations:  [place] / Referral to community resources:  [place]  Patient/Family's Response to care: Pt  Pleasant and sitting in bed with CSW entered pt room. Pt stated he is happy with the care he is receiving at [facility]   Patient/Family's Understanding of and Emotional Response to Diagnosis, Current Treatment, and Prognosis:Pt appears to have a good  understanding of reason for admission into the hospital and with pt care plan.   Emotional Assessment Appearance:  Appears stated age Attitude/Demeanor/Rapport:   (Pleasant) Affect (typically observed):  Accepting, Appropriate, Pleasant Orientation:  Oriented to Self, Oriented to Place, Oriented to  Time, Oriented to Situation Alcohol / Substance use:  Not Applicable Psych involvement (Current and /or in the community):  No (Comment)  Discharge Needs  Concerns to be addressed:  Discharge Planning Concerns Readmission within the last 30 days:  No Current discharge risk:  None Barriers to Discharge:  Continued Medical Work up   [employer], LCSW 11/26/2015, 12:42 PM

## 2015-11-27 DIAGNOSIS — D649 Anemia, unspecified: Secondary | ICD-10-CM

## 2015-11-27 LAB — CBC
HCT: 28.7 % — ABNORMAL LOW (ref 39.0–52.0)
HEMOGLOBIN: 9.2 g/dL — AB (ref 13.0–17.0)
MCH: 30.9 pg (ref 26.0–34.0)
MCHC: 32.1 g/dL (ref 30.0–36.0)
MCV: 96.3 fL (ref 78.0–100.0)
Platelets: 133 10*3/uL — ABNORMAL LOW (ref 150–400)
RBC: 2.98 MIL/uL — ABNORMAL LOW (ref 4.22–5.81)
RDW: 14.4 % (ref 11.5–15.5)
WBC: 8 10*3/uL (ref 4.0–10.5)

## 2015-11-27 MED ORDER — FERROUS SULFATE 325 (65 FE) MG PO TABS: 325.0000 mg | ORAL_TABLET | Freq: Three times a day (TID) | ORAL | 0 refills | Status: AC

## 2015-11-27 MED ORDER — ACETAMINOPHEN 325 MG PO TABS: 650.0000 mg | ORAL_TABLET | Freq: Four times a day (QID) | ORAL | Status: AC | PRN

## 2015-11-27 MED ORDER — ALUM & MAG HYDROXIDE-SIMETH 200-200-20 MG/5ML PO SUSP: 30.0000 mL | ORAL | 0 refills | Status: AC | PRN

## 2015-11-27 NOTE — Progress Notes (Signed)
An op note was dictated under dictation [phone]

## 2015-11-27 NOTE — Op Note (Signed)
Douglas Vargas, Douglas Vargas                ACCOUNT NO.:  [id number]  MEDICAL RECORD NO.:  [id number]  LOCATION:  [id number]                        FACILITY:  [facility]  PHYSICIAN:  Alta Corning, M.D.   DATE OF BIRTH:  06-07-1950  DATE OF PROCEDURE:  11/24/2015 DATE OF DISCHARGE:  11/27/2015                              OPERATIVE REPORT   PREOPERATIVE DIAGNOSIS:  Femoral neck fracture, left.  POSTOPERATIVE DIAGNOSIS:  Femoral neck fracture, left.  PROCEDURE: 1. Left hemiarthroplasty from an anterior approach with a Corail size     14 stem with a 49 mm monopolar ball. 2. Interpretation of multiple intraoperative fluoroscopic images.  SURGEON:  Alta Corning, M.D.  ASSISTANT:  Laure Kidney, RNFA.  ANESTHESIA:  General.  BRIEF HISTORY:  Douglas Vargas is a 65 year old male with long history of significant complaints of left hip pain after a fall.  He suffered a displaced left femoral neck fracture.  We had a long discussion of treatment options after failure of conservative care.  He was taken to the operating room for left hemiarthroplasty from an anterior approach. Because of his need for immediacy of walking and ambulation, I felt that the anterior approach was appropriate.  He was brought to the operating room for this procedure.  DESCRIPTION OF PROCEDURE:  The patient was brought to the operating room.  After adequate anesthesia was obtained with general anesthetic, the patient was placed supine on the operating table.  He then moved onto the [place] bed.  All bony prominences well padded after he was placed in the boots and this was attached to the Hana bed.  At this point, the left hip was prepped and draped in usual sterile fashion.  Following this, incision was made for an anterior approach to the hip, subcutaneous tissue down to the tensor fascia was clearly identified. The fascia was divided.  The muscle was finger fractured and retractors were put in place above the femoral neck.  The  capsule was opened and tagged and a provisional femoral neck cut was made and then the fractured portion of the head was removed, with these excess portions of bone.  The head was taken to the back table, sized to a 49, the 49 hip ball was trialed and had an excellent fit with suction fit with easy range of motion.  At this point, attention was turned towards using the arm for the femur and the leg was placed into an extended and abducted position.  The canal was sequentially rasped up to a level of 14 mm and a 14 Corail stem was placed.  Excellent fit and fill was achieved.  A trial reduction was undertaken.  The leg lengths were assessed.  At this point, the final implant was put in place with a +0 49 mm hip ball.  The hip was then reduced.  Excellent stability and range of motion were achieved.  The capsule was closed with 0 Vicryl running skin.  The tensor fascia was closed with 1 Vicryl running, the skin with 0 and 2-0 Vicryl and skin staples.  Sterile compressive dressing was applied.  The patient was taken to the recovery and noted to be in satisfactory  condition.  Of note, during surgery, multiple intraoperative fluoroscopic images were taken to assess fit and fill of the stem as well as leg length.  Once the case was closed, the patient was taken to the recovery room and was noted to be in satisfactory condition.  Estimated blood loss can be gotten from the anesthetic record.     Alta Corning, M.D.     Douglas Vargas  D:  11/27/2015  T:  11/27/2015  Job:  [id number]

## 2015-11-27 NOTE — Progress Notes (Signed)
Attempted report x2 at [facility]. 2nd attempt they took call back number and stated they will call RN for report.   [place]: [phone]

## 2015-11-27 NOTE — Discharge Summary (Signed)
Physician Discharge Summary  Douglas Vargas  [id number]  DOB: February 13, 1951  DOA: 11/23/2015 PCP: No primary care provider on file.  Admit date: 11/23/2015 Discharge date: 11/27/2015  Admitted From: SNF  Disposition: SNF   Recommendations for Outpatient Follow-up:  1. Follow up with PCP in 3-5 days 2. Please obtain BMP/CBC 3-5 days 3. Follow up with Ortho in 2 weeks Dr. Berenice Primas  4. Physical therapy   Home Health: None  Equipment/Devices: Wound care and dressing changes of hip   Discharge Condition: Stable  CODE STATUS: FULL  Diet recommendation: Heart Healthy   Brief/Interim Summary:  65 y.o.malewith medical history significant of hypertension, hyperlipidemia, SDH, TIA, GERD, who presentedwith left hip pain after mechanicalfall. X-ray of left hip confirmed left femoral neck fracture. Transferred to [facility] on 11/24/2015 for surgery at the request of the orthopedic surgeon. Now s/p Left hip hemi arthroplasty day #2, doing well with recovery post op. Other medical conditions stable. Cleared by ortho for rehab.     Subjective:  Patient seen and examined at bedside this am. No complaints overnight. Continues will sore pain at the site of the procedure but well controlled with meds. Denies, chest pain, shortness of breath, dizziness palpitations, N/V/D and headaches.   Discharge Diagnoses:   Closed fracture of left femoral neck with displacement:2/2 to mechanical fall, s/p Left hip hemi arthroplasty day #3  - Rehab and physical therapy  - Pain med as needed - Follow up with ortho   Leukocytosis: Resolved Likely due to stress-induced demargination.   HTN: Stable  - Continue home medications  Anemia - likely 2/2 to blood loss during surgery - stable patient asymptomatic  - Repeat CBC in 1 week - Iron supplement   [id number] -Continue home medications: Lipitor  GERD: Stable  -Protonix  TIA (transient ischemic attack):No new issues. -Continue  aspirin and Lipitor   Discharge Instructions  Discharge Instructions    Call MD for:  difficulty breathing, headache or visual disturbances    Complete by:  As directed    Call MD for:  extreme fatigue    Complete by:  As directed    Call MD for:  hives    Complete by:  As directed    Call MD for:  persistant dizziness or light-headedness    Complete by:  As directed    Call MD for:  persistant nausea and vomiting    Complete by:  As directed    Call MD for:  redness, tenderness, or signs of infection (pain, swelling, redness, odor or green/yellow discharge around incision site)    Complete by:  As directed    Call MD for:  severe uncontrolled pain    Complete by:  As directed    Call MD for:  temperature >100.4    Complete by:  As directed    Diet - low sodium heart healthy    Complete by:  As directed    Discharge instructions    Complete by:  As directed    Discharge instructions    Complete by:  As directed    Weight bearing as tolerated  Follow with PMD in 2-3 days Repeat CBC with PMD   Increase activity slowly    Complete by:  As directed    Weight bearing as tolerated    Complete by:  As directed    Laterality:  left   Extremity:  Lower       Medication List    TAKE these medications  acetaminophen 325 MG tablet Commonly known as:  TYLENOL Take 2 tablets (650 mg total) by mouth every 6 (six) hours as needed for mild pain (or Fever >/= 101).   alum & mag hydroxide-simeth 200-200-20 MG/5ML suspension Commonly known as:  MAALOX/MYLANTA Take 30 mLs by mouth every 4 (four) hours as needed for indigestion.   aspirin EC 325 MG tablet Take 1 tablet (325 mg total) by mouth 2 (two) times daily after a meal. Take x 1 month post op to decrease risk of blood clots. What changed:  medication strength  how much to take  when to take this  additional instructions   atorvastatin 80 MG tablet Commonly known as:  LIPITOR Take 80 mg by mouth daily.   CERTAGEN  PO Take by mouth daily.   cetirizine 10 MG tablet Commonly known as:  ZYRTEC Take 10 mg by mouth daily.   Cholecalciferol 2000 units Tabs Take 4,000 Units by mouth daily. Take 2 tablets=4000 units by mouth daily.   ferrous sulfate 325 (65 FE) MG tablet Take 1 tablet (325 mg total) by mouth 3 (three) times daily with meals.   fluticasone 50 MCG/ACT nasal spray Commonly known as:  FLONASE Place 1 spray into both nostrils daily.   HYDROcodone-acetaminophen 5-325 MG tablet Commonly known as:  NORCO/VICODIN Take 1-2 tablets by mouth every 4 (four) hours as needed for severe pain. What changed:  how much to take  reasons to take this   metoprolol tartrate 25 MG tablet Commonly known as:  LOPRESSOR Take 25 mg by mouth 2 (two) times daily.   senna 8.6 MG Tabs tablet Commonly known as:  SENOKOT Take 1 tablet by mouth daily.   thiamine 100 MG tablet Commonly known as:  VITAMIN B-1 Take 100 mg by mouth daily.      Follow-up Information    GRAVES,JOHN L, MD. Schedule an appointment as soon as possible for a visit in 2 weeks.   Specialty:  Orthopedic Surgery Contact information: [place] [address] [phone]          Allergies  Allergen Reactions  . Sulfa Antibiotics Other (See Comments)    On MAR    Consultations:  Orthopedics surgery - Dr Dorna Leitz    Procedures/Studies: Dg Chest 1 View  Result Date: 11/23/2015 CLINICAL DATA:  Patient fell at his home this pm, landed on buttocks, posterior hip pain EXAM: CHEST 1 VIEW COMPARISON:  None. FINDINGS: Shallow inspiration. Heart size and pulmonary vascularity are normal. No focal airspace disease or consolidation in the lungs. Mild peribronchial thickening suggesting chronic bronchitis. No blunting of costophrenic angles. No pneumothorax. Mediastinal contours appear intact. Probable old fracture deformity of the left proximal humerus. IMPRESSION: Mild chronic bronchitic changes in the lungs. No  evidence of active pulmonary disease. Electronically Signed   By: Lucienne Capers M.D.   On: 11/23/2015 23:43   Ct Head Wo Contrast  Result Date: 11/24/2015 CLINICAL DATA:  Initial evaluation for acute trauma, fall the EXAM: CT HEAD WITHOUT CONTRAST CT CERVICAL SPINE WITHOUT CONTRAST TECHNIQUE: Multidetector CT imaging of the head and cervical spine was performed following the standard protocol without intravenous contrast. Multiplanar CT image reconstructions of the cervical spine were also generated. COMPARISON:  None. FINDINGS: CT HEAD FINDINGS Brain: Age appropriate cerebral atrophy present. Mild chronic microvascular ischemic disease. Extensive encephalomalacia within the right parietotemporal region, compatible with remote right MCA territory infarct. Scattered remote bilateral basal ganglia lacunar infarcts present as well, right greater than left. No  acute intracranial hemorrhage. No evidence for acute large vessel territory infarct. No mass lesion, midline shift, or mass effect. Ex vacuo dilatation of the right lateral ventricle related to the remote right MCA territory infarct. No hydrocephalus. No extra-axial fluid collection. Vascular: Prominent vascular calcifications present within the carotid siphons and distal vertebral arteries. No hyperdense vessel. Skull: Small posterior scalp contusion. Scalp soft tissues otherwise within normal limits. Calvarium intact. Sinuses/Orbits: The globes and orbital soft tissues within normal limits. Minimal mucosal thickening within the left maxillary sinus. Paranasal sinuses are otherwise clear. Chronic opacification of the bilateral mastoid air cells noted, of doubtful acute clinical significance. Other: No other significant finding. CT CERVICAL SPINE FINDINGS Alignment: Vertebral bodies normally aligned with preservation of the normal cervical lordosis. No listhesis. Skull base and vertebrae: Visualized skullbase intact. Vertebral bodies intact without evidence  for fracture. Soft tissues and spinal canal: Paraspinous soft tissues demonstrate no acute abnormality. Prominent vascular calcifications noted about the carotid bifurcations. 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 Upper chest: Visualized lung apices are clear.  No pneumothorax. Other: No other significant finding. IMPRESSION: CT BRAIN: 1. No acute intracranial process. 2. Small posterior scalp contusion.  No skull fracture. 3. Remote right MCA territory infarct, with additional remote lacunar infarcts involving the bilateral basal ganglia. CT CERVICAL SPINE: 1. No acute traumatic injury within the cervical spine. Electronically Signed   By: Jeannine Boga M.D.   On: 11/24/2015 00:00   Ct Cervical Spine Wo Contrast  Result Date: 11/24/2015 CLINICAL DATA:  Initial evaluation for acute trauma, fall the EXAM: CT HEAD WITHOUT CONTRAST CT CERVICAL SPINE WITHOUT CONTRAST TECHNIQUE: Multidetector CT imaging of the head and cervical spine was performed following the standard protocol without intravenous contrast. Multiplanar CT image reconstructions of the cervical spine were also generated. COMPARISON:  None. FINDINGS: CT HEAD FINDINGS Brain: Age appropriate cerebral atrophy present. Mild chronic microvascular ischemic disease. Extensive encephalomalacia within the right parietotemporal region, compatible with remote right MCA territory infarct. Scattered remote bilateral basal ganglia lacunar infarcts present as well, right greater than left. No acute intracranial hemorrhage. No evidence for acute large vessel territory infarct. No mass lesion, midline shift, or mass effect. Ex vacuo dilatation of the right lateral ventricle related to the remote right MCA territory infarct. No hydrocephalus. No extra-axial fluid collection. Vascular: Prominent vascular calcifications present within the carotid siphons and distal vertebral arteries. No hyperdense vessel. Skull: Small posterior scalp  contusion. Scalp soft tissues otherwise within normal limits. Calvarium intact. Sinuses/Orbits: The globes and orbital soft tissues within normal limits. Minimal mucosal thickening within the left maxillary sinus. Paranasal sinuses are otherwise clear. Chronic opacification of the bilateral mastoid air cells noted, of doubtful acute clinical significance. Other: No other significant finding. CT CERVICAL SPINE FINDINGS Alignment: Vertebral bodies normally aligned with preservation of the normal cervical lordosis. No listhesis. Skull base and vertebrae: Visualized skullbase intact. Vertebral bodies intact without evidence for fracture. Soft tissues and spinal canal: Paraspinous soft tissues demonstrate no acute abnormality. Prominent vascular calcifications noted about the carotid bifurcations. 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 Upper chest: Visualized lung apices are clear.  No pneumothorax. Other: No other significant finding. IMPRESSION: CT BRAIN: 1. No acute intracranial process. 2. Small posterior scalp contusion.  No skull fracture. 3. Remote right MCA territory infarct, with additional remote lacunar infarcts involving the bilateral basal ganglia. CT CERVICAL SPINE: 1. No acute traumatic injury within the cervical spine. Electronically Signed   By: Marland Kitchen  Jeannine Boga M.D.   On: 11/24/2015 00:00   Dg C-arm 1-60 Min  Result Date: 11/24/2015 CLINICAL DATA:  Left hemiarthroplasty of hip with anterior approach. Main OR 3. Fluoro time 0 mins 6 sec. EXAM: OPERATIVE LEFT HIP (WITH PELVIS IF PERFORMED) 2 VIEWS TECHNIQUE: Fluoroscopic spot image(s) were submitted for interpretation post-operatively. COMPARISON:  11/23/2015 FINDINGS: AP views are performed of the left hip demonstrating interval hemiarthroplasty. The hardware appears intact. No evidence for dislocation on the frontal views provided. No interval fractures. IMPRESSION: Left hip arthroplasty.  No adverse features.  Electronically Signed   By: Nolon Nations M.D.   On: 11/24/2015 13:56   Dg Hip Operative Unilat W Or W/o Pelvis Left  Result Date: 11/24/2015 CLINICAL DATA:  Left hemiarthroplasty of hip with anterior approach. Main OR 3. Fluoro time 0 mins 6 sec. EXAM: OPERATIVE LEFT HIP (WITH PELVIS IF PERFORMED) 2 VIEWS TECHNIQUE: Fluoroscopic spot image(s) were submitted for interpretation post-operatively. COMPARISON:  11/23/2015 FINDINGS: AP views are performed of the left hip demonstrating interval hemiarthroplasty. The hardware appears intact. No evidence for dislocation on the frontal views provided. No interval fractures. IMPRESSION: Left hip arthroplasty.  No adverse features. Electronically Signed   By: Nolon Nations M.D.   On: 11/24/2015 13:56   Dg Hip Unilat W Or Wo Pelvis 2-3 Views Left  Result Date: 11/23/2015 CLINICAL DATA:  Initial evaluation for acute trauma, fall. EXAM: DG HIP (WITH OR WITHOUT PELVIS) 2-3V LEFT COMPARISON:  None. FINDINGS: There is an acute transverse fracture through the subcapital region of the left femoral neck with slight superior subluxation. Femoral head remains normally aligned within the acetabulum. Remainder the bony pelvis intact. SI joints approximated. No acute abnormality about the right hip. Degenerative changes noted within the lower lumbar spine. Prominent vascular calcifications noted. IMPRESSION: Acute fracture through the left femoral neck with slight superior subluxation. Electronically Signed   By: Jeannine Boga M.D.   On: 11/23/2015 23:35   (Echo, Carotid, EGD, Colonoscopy, ERCP)    Discharge Exam: Vitals:   11/27/15 0545 11/27/15 0840  BP: 115/60 (!) 108/51  Pulse: 78 75  Resp:  16  Temp: 98.6 F (37 C)    Vitals:   11/26/15 1411 11/26/15 2000 11/27/15 0545 11/27/15 0840  BP: (!) 112/52 104/66 115/60 (!) 108/51  Pulse: 84 86 78 75  Resp: 17   16  Temp: 98.8 F (37.1 C) 98.5 F (36.9 C) 98.6 F (37 C)   TempSrc:  Oral Oral   SpO2:  94% 93% 90% 97%  Weight:      Height:        General: Pt is alert, awake, not in acute distress Cardiovascular: RRR, S1/S2 +, no rubs, no gallops Respiratory: CTA bilaterally, no wheezing, no rhonchi Abdominal: Soft, NT, ND, bowel sounds + Extremities:  No pedal edema, no cyanosis. Dressing on Left hip dry and intact     The results of significant diagnostics from this hospitalization (including imaging, microbiology, ancillary and laboratory) are listed below for reference.     Microbiology: Recent Results (from the past 240 hour(s))  Surgical pcr screen     Status: None   Collection Time: 11/24/15  4:03 AM  Result Value Ref Range Status   MRSA, PCR NEGATIVE NEGATIVE Final   Staphylococcus aureus NEGATIVE NEGATIVE Final    Comment:        The Xpert SA Assay (FDA approved for NASAL specimens in patients over 28 years of age), is one component of a comprehensive surveillance  program.  Test performance has been validated by [facility] for patients greater than or equal to 81 year old. It is not intended to diagnose infection nor to guide or monitor treatment.      Labs: BNP (last 3 results) No results for input(s): BNP in the last 8760 hours. Basic Metabolic Panel:  Recent Labs Lab 11/24/15 0323 11/25/15 0536  NA 137 138  K 4.2 3.9  CL 105 108  CO2 23 24  GLUCOSE 134* 152*  BUN 22* 9  CREATININE 1.02 0.90  CALCIUM 9.1 8.2*   Liver Function Tests: No results for input(s): AST, ALT, ALKPHOS, BILITOT, PROT, ALBUMIN in the last 168 hours. No results for input(s): LIPASE, AMYLASE in the last 168 hours. No results for input(s): AMMONIA in the last 168 hours. CBC:  Recent Labs Lab 11/23/15 2342 11/24/15 0323 11/25/15 0536 11/26/15 0455 11/27/15 0305  WBC 16.4* 19.6* 13.2* 9.3 8.0  NEUTROABS 13.8*  --   --   --   --   HGB 15.9 15.9 10.7* 9.9* 9.2*  HCT 43.7 45.5 31.6* 30.1* 28.7*  MCV 89.0 91.2 93.5 95.6 96.3  PLT 174 143* 117* 100* 133*   Cardiac  Enzymes: No results for input(s): CKTOTAL, CKMB, CKMBINDEX, TROPONINI in the last 168 hours. BNP: Invalid input(s): POCBNP CBG: No results for input(s): GLUCAP in the last 168 hours. D-Dimer No results for input(s): DDIMER in the last 72 hours. Hgb A1c No results for input(s): HGBA1C in the last 72 hours. Lipid Profile No results for input(s): CHOL, HDL, LDLCALC, TRIG, CHOLHDL, LDLDIRECT in the last 72 hours. Thyroid function studies No results for input(s): TSH, T4TOTAL, T3FREE, THYROIDAB in the last 72 hours.  Invalid input(s): FREET3 Anemia work up No results for input(s): VITAMINB12, FOLATE, FERRITIN, TIBC, IRON, RETICCTPCT in the last 72 hours. Urinalysis No results found for: COLORURINE, APPEARANCEUR, [place], [place], [place], [place], [place], [place], PROTEINUR, UROBILINOGEN, NITRITE, LEUKOCYTESUR Sepsis Labs Invalid input(s): PROCALCITONIN,  WBC,  LACTICIDVEN Microbiology Recent Results (from the past 240 hour(s))  Surgical pcr screen     Status: None   Collection Time: 11/24/15  4:03 AM  Result Value Ref Range Status   MRSA, PCR NEGATIVE NEGATIVE Final   Staphylococcus aureus NEGATIVE NEGATIVE Final    Comment:        The Xpert SA Assay (FDA approved for NASAL specimens in patients over 37 years of age), is one component of a comprehensive surveillance program.  Test performance has been validated by [facility] for patients greater than or equal to 25 year old. It is not intended to diagnose infection nor to guide or monitor treatment.     Time coordinating discharge: Over 30 minutes  SIGNED:  Chipper Oman, MD  [facility] 11/27/2015, 10:29 AM Pager   If 7PM-7AM, please contact night-coverage [email or web address] Password [email or web address]

## 2015-11-27 NOTE — Progress Notes (Signed)
   PATIENT ID: Douglas Vargas   3 Days Post-Op Procedure(s) (LRB): ANTERIOR APPROACH HEMI HIP ARTHROPLASTY (Left)  Subjective: Doing well, minimal discomfort. Telling me he has done some standing and taken a few steps. Hopes to walk to the bathroom. Hopes for d/c today to SNF.  Objective:  Vitals:   11/27/15 0545 11/27/15 0840  BP: 115/60 (!) 108/51  Pulse: 78 75  Resp:  16  Temp: 98.6 F (37 C)     L hip dressing c/d/i Wiggles toes, distally NVI Calf soft, nontender   Labs:   Recent Labs  11/25/15 0536 11/26/15 0455 11/27/15 0305  HGB 10.7* 9.9* 9.2*   Recent Labs  11/26/15 0455 11/27/15 0305  WBC 9.3 8.0  RBC 3.15* 2.98*  HCT 30.1* 28.7*  PLT 100* 133*   Recent Labs  11/25/15 0536  NA 138  K 3.9  CL 108  CO2 24  BUN 9  CREATININE 0.90  GLUCOSE 152*  CALCIUM 8.2*    Assessment and Plan: 3 Days Post-OpProcedure(s) (LRB): ANTERIOR APPROACH HEMI HIP ARTHROPLASTY (Left) Plan: Weight-bear as tolerated on left without hip precautions. Aspirin 325 mg twice daily enteric-coated 1 month postop for DVT prophylaxis. Norco 5 mg as needed for pain. (Rx written) Up with therapy Discharge to today, d/c per primary team Will need follow-up with Dr. Berenice Primas in the office in 2 weeks.

## 2015-11-27 NOTE — NC FL2 (Signed)
[place] LEVEL OF CARE SCREENING TOOL     IDENTIFICATION  Patient Name: Douglas Vargas Birthdate: 10-13-1950 Sex: male Admission Date (Current Location): 11/23/2015  [facility] and [place] Number:  Herbalist and Address:  [facility], [place] [address], [place], [place] [address]      Provider Number: [id number]  Attending Physician Name and Address:  Doreatha Lew, MD  Relative Name and Phone Number:       Current Level of Care: Hospital Recommended Level of Care: [place] Prior Approval Number:    Date Approved/Denied:   PASRR Number:  ([id number])  Discharge Plan: SNF    Current Diagnoses: Patient Active Problem List   Diagnosis Date Noted  . Fall 11/24/2015  . Fracture of femoral neck, left, closed 11/24/2015  . TIA (transient ischemic attack) 11/24/2015  . Leukocytosis 11/24/2015  . Closed left hip fracture ([place]) 11/24/2015  . Pure hypercholesterolemia 07/13/2013  . Unspecified constipation 07/13/2013  . Anemia of other chronic disease 01/01/2013  . Essential thrombocythemia ([place]) 01/01/2013  . Nausea alone 12/22/2012  . HTN (hypertension) 12/22/2012  . GERD (gastroesophageal reflux disease) 12/22/2012    Orientation RESPIRATION BLADDER Height & Weight     Self, Time, Situation, Place  Normal Continent Weight: 202 lb 1.6 oz (91.7 kg) Height:  6' (182.9 cm)  BEHAVIORAL SYMPTOMS/MOOD NEUROLOGICAL BOWEL NUTRITION STATUS   (none)  (none) Continent  (regular)  AMBULATORY STATUS COMMUNICATION OF NEEDS Skin   Extensive Assist Verbally Surgical wounds                       Personal Care Assistance Level of Assistance  Bathing, Feeding, Dressing Bathing Assistance: Limited assistance Feeding assistance: Independent Dressing Assistance: Limited assistance     Functional Limitations Info  Sight, Hearing, Speech Sight Info: Adequate Hearing Info: Adequate Speech Info: Adequate     SPECIAL CARE FACTORS FREQUENCY  PT (By licensed PT), OT (By licensed OT)     PT Frequency:  (5x/week) OT Frequency:  (5x/week)            Contractures Contractures Info: Not present    Additional Factors Info  Code Status, Allergies Code Status Info:  (full) Allergies Info:  (Sulfa Antibiotics)           Current Medications (11/27/2015):  This is the current hospital active medication list Current Facility-Administered Medications  Medication Dose Route Frequency Provider Last Rate Last Dose  . acetaminophen (TYLENOL) tablet 650 mg  650 mg Oral Q6H PRN Gary Fleet, PA-C   650 mg at 11/27/15 1031   Or  . acetaminophen (TYLENOL) suppository 650 mg  650 mg Rectal Q6H PRN Gary Fleet, PA-C      . alum & mag hydroxide-simeth (MAALOX/MYLANTA) 200-200-20 MG/5ML suspension 30 mL  30 mL Oral Q4H PRN Gary Fleet, PA-C      . aspirin EC tablet 325 mg  325 mg Oral BID WC Gary Fleet, PA-C   325 mg at 11/27/15 0844  . atorvastatin (LIPITOR) tablet 80 mg  80 mg Oral Daily Ivor Costa, MD   80 mg at 11/26/15 1815  . cholecalciferol (VITAMIN D) tablet 4,000 Units  4,000 Units Oral Daily Ivor Costa, MD   4,000 Units at 11/27/15 0845  . ferrous sulfate tablet 325 mg  325 mg Oral BID WC Gary Fleet, PA-C   325 mg at 11/27/15 0843  . fluticasone (FLONASE) 50 MCG/ACT nasal spray 1 spray  1 spray Each Nare Daily Ivor Costa, MD      . guaiFENesin [facility]) 12 hr tablet 600 mg  600 mg Oral BID Doreatha Lew, MD   600 mg at 11/27/15 0845  . hydrALAZINE (APRESOLINE) injection 5 mg  5 mg Intravenous Q2H PRN Ivor Costa, MD      . HYDROcodone-acetaminophen (NORCO/VICODIN) 5-325 MG per tablet 1 tablet  1 tablet Oral Q4H PRN Ivor Costa, MD   1 tablet at 11/26/15 2213  . loratadine (CLARITIN) tablet 10 mg  10 mg Oral Daily Ivor Costa, MD   10 mg at 11/27/15 0845  . methocarbamol (ROBAXIN) tablet 500 mg  500 mg Oral Q6H PRN Gary Fleet, PA-C   500 mg at 11/25/15 2028   Or  . methocarbamol  (ROBAXIN) 500 mg in dextrose 5 % 50 mL IVPB  500 mg Intravenous Q6H PRN Gary Fleet, PA-C      . metoprolol tartrate (LOPRESSOR) tablet 25 mg  25 mg Oral BID Ivor Costa, MD   25 mg at 11/27/15 0845  . morphine 2 MG/ML injection 2 mg  2 mg Intravenous Q4H PRN Ivor Costa, MD   2 mg at 11/24/15 0345  . multivitamin with minerals tablet 1 tablet  1 tablet Oral Daily Ivor Costa, MD   1 tablet at 11/27/15 0845  . ondansetron (ZOFRAN) injection 4 mg  4 mg Intravenous Q8H PRN Ivor Costa, MD   4 mg at 11/24/15 0349  . ondansetron (ZOFRAN) tablet 4 mg  4 mg Oral Q6H PRN Gary Fleet, PA-C       Or  . ondansetron [facility]) injection 4 mg  4 mg Intravenous Q6H PRN Gary Fleet, PA-C      . polyethylene glycol (MIRALAX / GLYCOLAX) packet 17 g  17 g Oral Daily PRN Ivor Costa, MD   17 g at 11/27/15 0844  . senna (SENOKOT) tablet 8.6 mg  1 tablet Oral Daily Ivor Costa, MD   8.6 mg at 11/27/15 0845  . thiamine (VITAMIN B-1) tablet 100 mg  100 mg Oral Daily Ivor Costa, MD   100 mg at 11/27/15 0845  . zolpidem (AMBIEN) tablet 5 mg  5 mg Oral QHS PRN Gary Fleet, PA-C         Discharge Medications: Please see discharge summary for a list of discharge medications.  Relevant Imaging Results:  Relevant Lab Results:   Additional Information [id number]  Junie Spencer, LCSW

## 2015-11-27 NOTE — Progress Notes (Signed)
[facility] to be D/C'd Nursing Home per MD order.  Discussed with the patient and all questions fully answered.  VSS, Dressing clean dry and intact.   IV catheter discontinued intact. Site without signs and symptoms of complications. Dressing and pressure applied.  Patient escorted via [facility] to [facility].   11:39 AM Report given to Dub Mikes, Nurse    [place], Canda Podgorski C 11/27/2015 11:37 AM

## 2015-11-27 NOTE — Clinical Social Work Placement (Signed)
   CLINICAL SOCIAL WORK PLACEMENT  NOTE  Date:  11/27/2015  Patient Details  Name: KAVEON ITO MRN: [id number] Date of Birth: 1950/09/28  Clinical Social Work is seeking post-discharge placement for this patient at the [place] level of care (*CSW will initial, date and re-position this form in  chart as items are completed):  Yes   Patient/family provided with [facility]'s list of facilities offering this level of care within the geographic area requested by the patient (or if unable, by the patient's family).  Yes   Patient/family informed of their freedom to choose among providers that offer the needed level of care, that participate in Medicare, Medicaid or managed care program needed by the patient, have an available bed and are willing to accept the patient.  Yes   Patient/family informed of [facility]'s ownership interest in [facility] and [facility], as well as of the fact that they are under no obligation to receive care at these facilities.  PASRR submitted to [employer] on       PASRR number received on       Existing PASRR number confirmed on 11/27/15     FL2 transmitted to all facilities in geographic area requested by pt/family on 11/27/15     FL2 transmitted to all facilities within larger geographic area on       Patient informed that his/her managed care company has contracts with or will negotiate with certain facilities, including the following:        Yes   Patient/family informed of bed offers received.  Patient chooses bed at       Physician recommends and patient chooses bed at      Patient to be transferred to   on  .  Patient to be transferred to facility by       Patient family notified on   of transfer.  Name of family member notified:        PHYSICIAN Please prepare priority discharge summary, including medications, Please prepare prescriptions, Please sign FL2     Additional Comment:     _______________________________________________ Junie Spencer, LCSW 11/27/2015, 11:09 AM

## 2015-11-27 NOTE — Progress Notes (Signed)
Voicemail left with CSW to make aware of discharge

## 2015-11-27 NOTE — Clinical Social Work Note (Signed)
CSW notified that pt medically stable for discharge back to SNF. CSW notified [facility] that pt discharging from hospital today and will arrive via [facility]. Discharge summary and orders faxed through the hub to SNF. CSW contacted [facility] to transport pt to SNF and requested RN call and give report.

## 2016-05-11 ENCOUNTER — Other Ambulatory Visit (HOSPITAL_COMMUNITY): Payer: Self-pay | Admitting: Internal Medicine

## 2016-05-11 DIAGNOSIS — E0590 Thyrotoxicosis, unspecified without thyrotoxic crisis or storm: Secondary | ICD-10-CM

## 2016-05-30 ENCOUNTER — Encounter (HOSPITAL_COMMUNITY)
Admission: RE | Admit: 2016-05-30 | Discharge: 2016-05-30 | Disposition: A | Discharge status: Home / Self Care | Payer: Medicare Other | Source: Ambulatory Visit | Attending: Internal Medicine | Admitting: Internal Medicine

## 2016-05-30 DIAGNOSIS — E0590 Thyrotoxicosis, unspecified without thyrotoxic crisis or storm: Secondary | ICD-10-CM | POA: Insufficient documentation

## 2016-05-30 MED ORDER — SODIUM IODIDE I 131 CAPSULE
12.6000 | Freq: Once | INTRAVENOUS | Status: AC | PRN
  Administered 2016-05-30: 12.6 via ORAL

## 2016-05-31 ENCOUNTER — Encounter (HOSPITAL_COMMUNITY)
Admission: RE | Admit: 2016-05-31 | Discharge: 2016-05-31 | Disposition: A | Discharge status: Home / Self Care | Payer: Medicare Other | Source: Ambulatory Visit | Attending: Internal Medicine | Admitting: Internal Medicine

## 2016-05-31 DIAGNOSIS — E0590 Thyrotoxicosis, unspecified without thyrotoxic crisis or storm: Secondary | ICD-10-CM | POA: Diagnosis not present

## 2016-05-31 MED ORDER — SODIUM PERTECHNETATE TC 99M INJECTION
10.7000 | Freq: Once | INTRAVENOUS | Status: AC | PRN
  Administered 2016-05-31: 10.7 via INTRAVENOUS

## 2017-05-25 IMAGING — CT CT CERVICAL SPINE W/O CM
4 of 8 series · 13 of 33 positions shown, 14 images · non-contrast
Comparison: None.

CLINICAL DATA: Initial evaluation for acute trauma, fall the

EXAM:
CT HEAD WITHOUT CONTRAST
CT CERVICAL SPINE WITHOUT CONTRAST
TECHNIQUE: Multidetector CT imaging of the head and cervical spine was
performed following the standard protocol without intravenous
contrast. Multiplanar CT image reconstructions of the cervical spine
were also generated.

[Series 6: coronal · coronal · 0.31mm/px · 3 of 70 slices shown]
[im 18/70  bone]
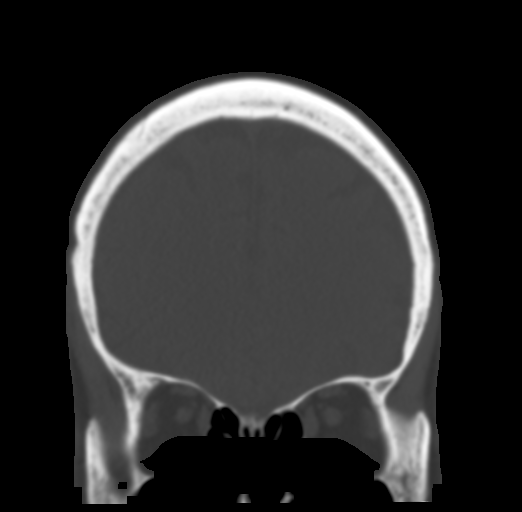
[im 35/70  bone]
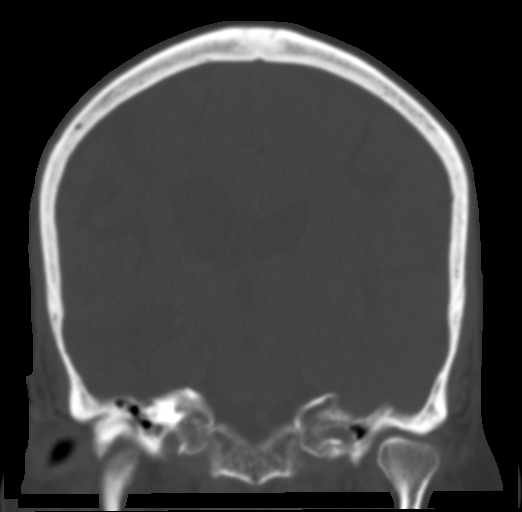
[im 52/70  bone]
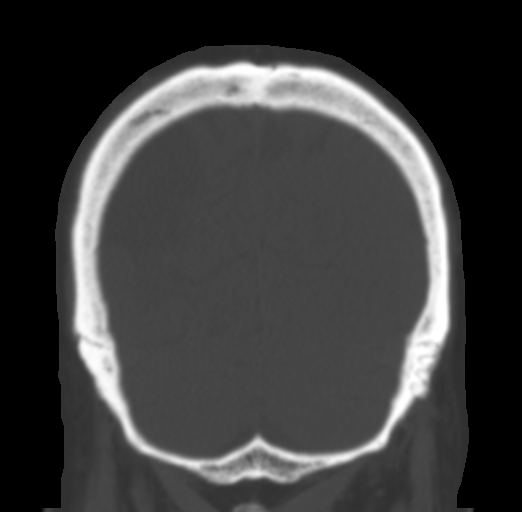

[Series 8: c-spine st · axial · 0.32mm/px · z∈[-358,-302]mm · 2 of 84 slices shown]
[im 28/84  bone]
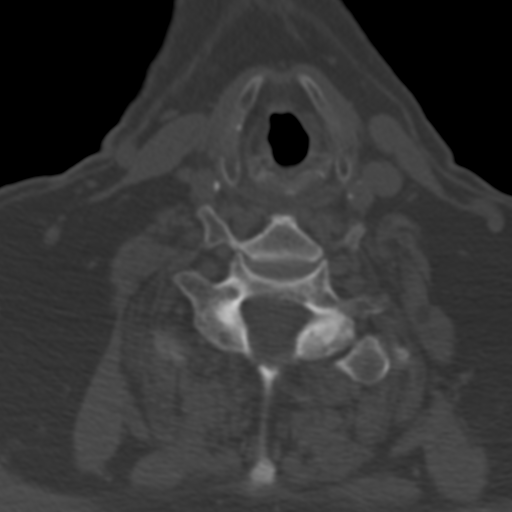
[im 56/84  bone]
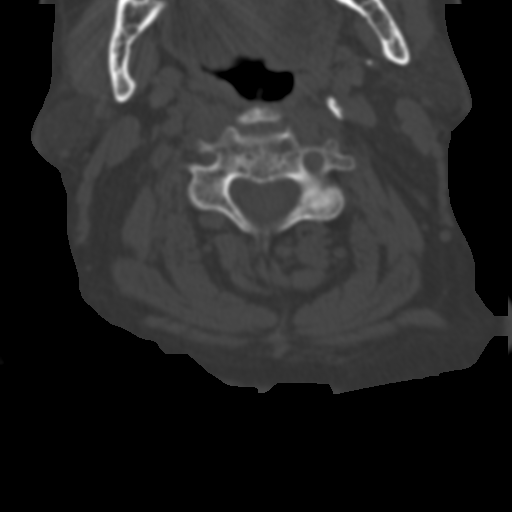

[Series 11: axial recon · axial · 0.23mm/px · z∈[-400,-319]mm · 3 of 96 slices shown, 4 images]
[im 24/96  soft-tissue]
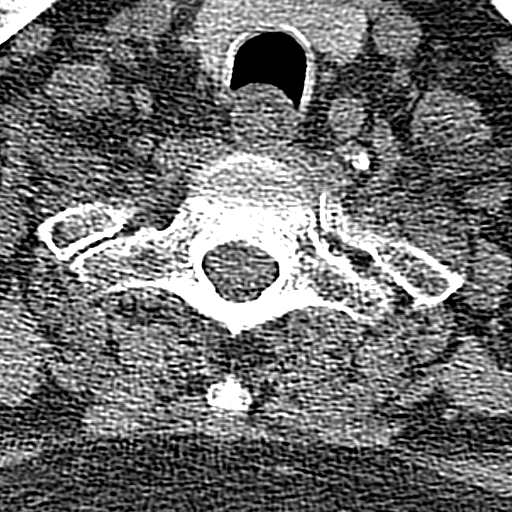
[im 24/96  bone]
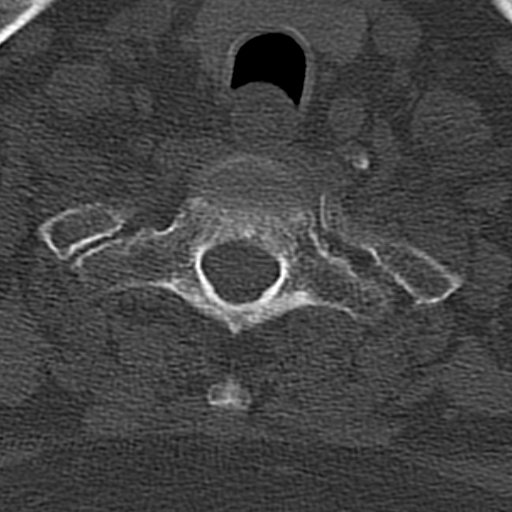
[im 48/96  bone]
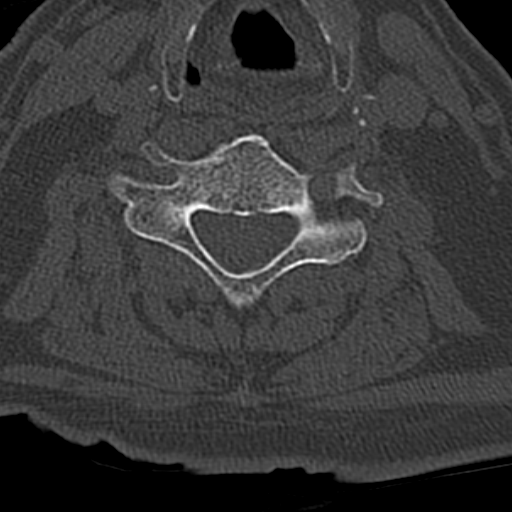
[im 72/96  bone]
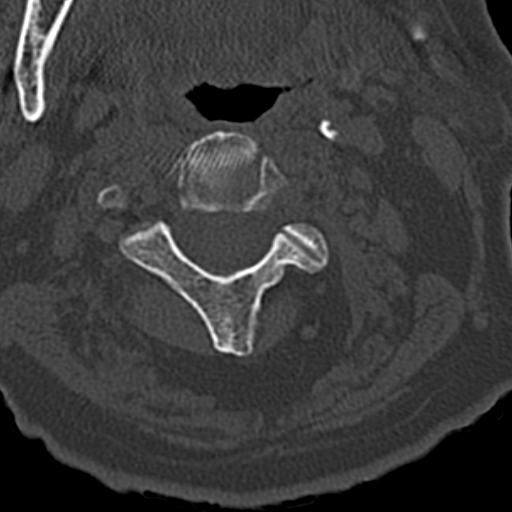

[Series 13: sagittal · sagittal · 0.33mm/px · 5 of 61 slices shown]
[im 11/61  bone]
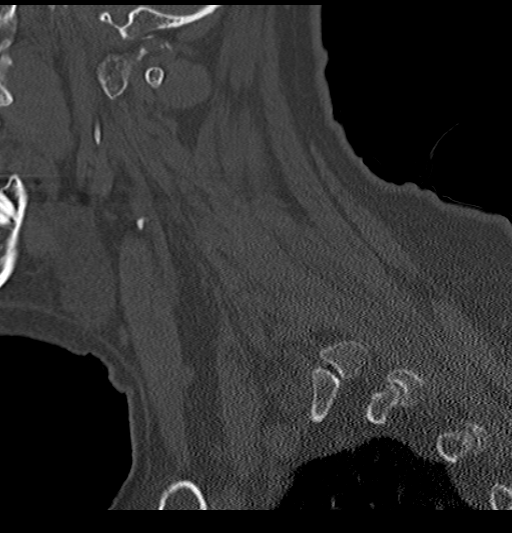
[im 21/61  bone]
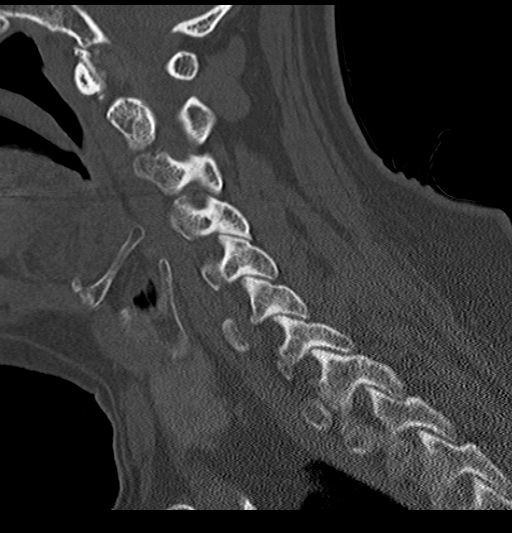
[im 31/61  bone]
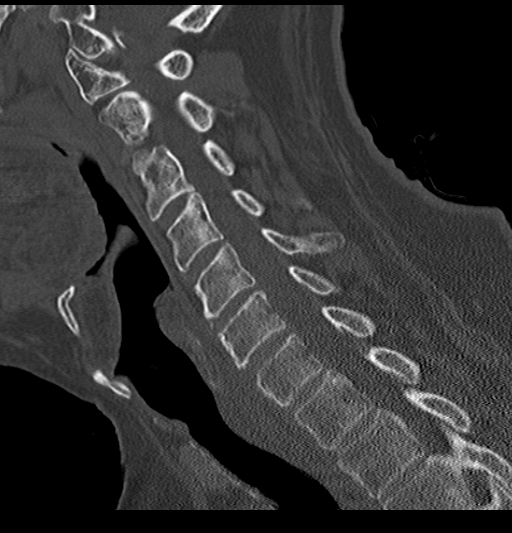
[im 41/61  bone]
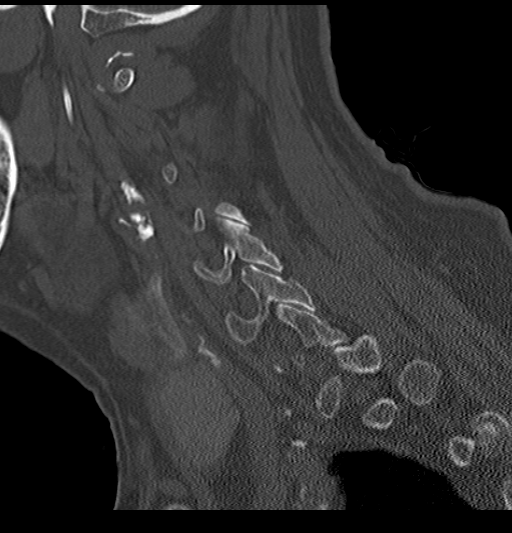
[im 51/61  bone]
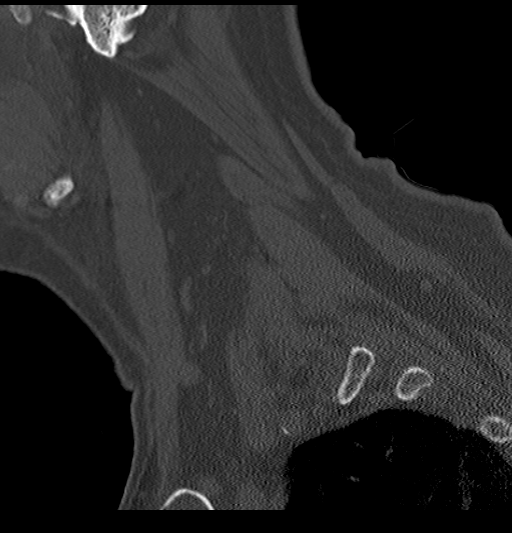

[13 of 33 positions shown; findings below may reference images not displayed]

FINDINGS: CT HEAD FINDINGS

Brain: Age appropriate cerebral atrophy present. Mild chronic
microvascular ischemic disease. Extensive encephalomalacia within
the right parietotemporal region, compatible with remote right MCA
territory infarct. Scattered remote bilateral basal ganglia lacunar
infarcts present as well, right greater than left.

No acute intracranial hemorrhage. No evidence for acute large vessel
territory infarct. No mass lesion, midline shift, or mass effect. Ex
vacuo dilatation of the right lateral ventricle related to the
remote right MCA territory infarct. No hydrocephalus. No extra-axial
fluid collection.

Vascular: Prominent vascular calcifications present within the
carotid siphons and distal vertebral arteries. No hyperdense vessel.

Skull: Small posterior scalp contusion. Scalp soft tissues otherwise
within normal limits. Calvarium intact.

Sinuses/Orbits: The globes and orbital soft tissues within normal
limits. Minimal mucosal thickening within the left maxillary sinus.
Paranasal sinuses are otherwise clear. Chronic opacification of the
bilateral mastoid air cells noted, of doubtful acute clinical
significance.

Other: No other significant finding.

CT CERVICAL SPINE FINDINGS

Alignment: Vertebral bodies normally aligned with preservation of
the normal cervical lordosis. No listhesis.

Skull base and vertebrae: Visualized skullbase intact. Vertebral
bodies intact without evidence for fracture.

Soft tissues and spinal canal: Paraspinous soft tissues demonstrate
no acute abnormality. Prominent vascular calcifications noted about
the carotid bifurcations.

Disc levels:  Mild degenerate spondylolysis at C5-6 and C6-7.

Upper chest: Visualized lung apices are clear.  No pneumothorax.

Other: No other significant finding.
IMPRESSION: CT BRAIN:

1. No acute intracranial process.
2. Small posterior scalp contusion.  No skull fracture.
3. Remote right MCA territory infarct, with additional remote
lacunar infarcts involving the bilateral basal ganglia.

CT CERVICAL SPINE:

1. No acute traumatic injury within the cervical spine.
